# Patient Record
Sex: Female | Born: 1965 | Hispanic: Yes | Smoking: Never smoker
Health system: Southern US, Community
[De-identification: ages and names within clinical notes are randomized; demographics above are authoritative.]

## PROBLEM LIST (undated history)

## (undated) DIAGNOSIS — I10 Essential (primary) hypertension: Secondary | ICD-10-CM

## (undated) DIAGNOSIS — F419 Anxiety disorder, unspecified: Secondary | ICD-10-CM

## (undated) DIAGNOSIS — E079 Disorder of thyroid, unspecified: Secondary | ICD-10-CM

## (undated) HISTORY — PX: WRIST SURGERY: SHX841

---

## 2016-02-02 ENCOUNTER — Emergency Department (HOSPITAL_COMMUNITY): Payer: Self-pay

## 2016-02-02 ENCOUNTER — Encounter (HOSPITAL_COMMUNITY): Payer: Self-pay | Admitting: *Deleted

## 2016-02-02 ENCOUNTER — Emergency Department (HOSPITAL_COMMUNITY)
Admission: EM | Admit: 2016-02-02 | Discharge: 2016-02-02 | Disposition: A | Discharge status: Home / Self Care | Payer: Self-pay | Attending: Emergency Medicine | Admitting: Emergency Medicine

## 2016-02-02 DIAGNOSIS — R791 Abnormal coagulation profile: Secondary | ICD-10-CM | POA: Insufficient documentation

## 2016-02-02 DIAGNOSIS — M542 Cervicalgia: Secondary | ICD-10-CM | POA: Insufficient documentation

## 2016-02-02 DIAGNOSIS — R200 Anesthesia of skin: Secondary | ICD-10-CM

## 2016-02-02 DIAGNOSIS — R202 Paresthesia of skin: Secondary | ICD-10-CM | POA: Insufficient documentation

## 2016-02-02 LAB — DIFFERENTIAL
Basophils Absolute: 0 10*3/uL (ref 0.0–0.1)
Basophils Relative: 0 %
EOS ABS: 0.5 10*3/uL (ref 0.0–0.7)
EOS PCT: 4 %
LYMPHS ABS: 2.8 10*3/uL (ref 0.7–4.0)
Lymphocytes Relative: 23 %
MONOS PCT: 6 %
Monocytes Absolute: 0.7 10*3/uL (ref 0.1–1.0)
Neutro Abs: 7.9 10*3/uL — ABNORMAL HIGH (ref 1.7–7.7)
Neutrophils Relative %: 67 %

## 2016-02-02 LAB — COMPREHENSIVE METABOLIC PANEL
ALT: 19 U/L (ref 14–54)
ANION GAP: 9 (ref 5–15)
AST: 18 U/L (ref 15–41)
Albumin: 3.9 g/dL (ref 3.5–5.0)
Alkaline Phosphatase: 70 U/L (ref 38–126)
BILIRUBIN TOTAL: 0.4 mg/dL (ref 0.3–1.2)
BUN: 7 mg/dL (ref 6–20)
CO2: 28 mmol/L (ref 22–32)
Calcium: 9.4 mg/dL (ref 8.9–10.3)
Chloride: 100 mmol/L — ABNORMAL LOW (ref 101–111)
Creatinine, Ser: 0.8 mg/dL (ref 0.44–1.00)
GFR calc Af Amer: >60 mL/min (ref >60)
Glucose, Bld: 102 mg/dL — ABNORMAL HIGH (ref 65–99)
POTASSIUM: 3.1 mmol/L — AB (ref 3.5–5.1)
Sodium: 137 mmol/L (ref 135–145)
TOTAL PROTEIN: 7.4 g/dL (ref 6.5–8.1)

## 2016-02-02 LAB — CBC
HEMATOCRIT: 42.5 % (ref 36.0–46.0)
HEMOGLOBIN: 13.9 g/dL (ref 12.0–15.0)
MCH: 27.9 pg (ref 26.0–34.0)
MCHC: 32.7 g/dL (ref 30.0–36.0)
MCV: 85.2 fL (ref 78.0–100.0)
Platelets: 343 10*3/uL (ref 150–400)
RBC: 4.99 MIL/uL (ref 3.87–5.11)
RDW: 14 % (ref 11.5–15.5)
WBC: 12 10*3/uL — ABNORMAL HIGH (ref 4.0–10.5)

## 2016-02-02 LAB — I-STAT CHEM 8, ED
BUN: 7 mg/dL (ref 6–20)
CALCIUM ION: 1.11 mmol/L — AB (ref 1.15–1.40)
CREATININE: 0.6 mg/dL (ref 0.44–1.00)
Chloride: 99 mmol/L — ABNORMAL LOW (ref 101–111)
Glucose, Bld: 102 mg/dL — ABNORMAL HIGH (ref 65–99)
HEMATOCRIT: 43 % (ref 36.0–46.0)
HEMOGLOBIN: 14.6 g/dL (ref 12.0–15.0)
Potassium: 3.1 mmol/L — ABNORMAL LOW (ref 3.5–5.1)
SODIUM: 140 mmol/L (ref 135–145)
TCO2: 28 mmol/L (ref 0–100)

## 2016-02-02 LAB — PROTIME-INR
INR: 1
Prothrombin Time: 13.2 seconds (ref 11.4–15.2)

## 2016-02-02 LAB — I-STAT TROPONIN, ED: TROPONIN I, POC: 0 ng/mL (ref 0.00–0.08)

## 2016-02-02 LAB — APTT: aPTT: 30 seconds (ref 24–36)

## 2016-02-02 MED ORDER — PREDNISONE 20 MG PO TABS: ORAL_TABLET | ORAL | 0 refills | Status: DC

## 2016-02-02 MED ORDER — POTASSIUM CHLORIDE CRYS ER 20 MEQ PO TBCR
40.0000 meq | EXTENDED_RELEASE_TABLET | Freq: Once | ORAL | Status: AC
  Administered 2016-02-02: 40 meq via ORAL
  Filled 2016-02-02: qty 2

## 2016-02-02 MED ORDER — OXYCODONE-ACETAMINOPHEN 5-325 MG PO TABS: 1.0000 | ORAL_TABLET | ORAL | 0 refills | Status: DC | PRN

## 2016-02-02 MED ORDER — OXYCODONE-ACETAMINOPHEN 5-325 MG PO TABS
1.0000 | ORAL_TABLET | Freq: Once | ORAL | Status: AC
  Administered 2016-02-02: 1 via ORAL
  Filled 2016-02-02: qty 1

## 2016-02-02 NOTE — ED Notes (Signed)
Brought patient back to room with family in tow; patient getting undressed and into a gown at this time 

## 2016-02-02 NOTE — Discharge Instructions (Signed)
Take the prescribed medication as directed. Follow-up with neurology regarding numbness/paresthesias in your right arm.  If you continue having issues with your neck as well, you may wish to see orthopedics-- number listed for you. Return to the ED for new or worsening symptoms.

## 2016-02-02 NOTE — ED Provider Notes (Signed)
[facility]-EMERGENCY DEPT Provider Note   CSN: [id number] Arrival date & time: 02/02/16  0006     History   Chief Complaint Chief Complaint  Patient presents with  . Numbness    HPI Linda Parsons is a 50 y.o. female.  The history is provided by the patient and medical records.    50 year old female with history of chronic neck pain, Presenting to the ED for numbness and weakness. Patient reports history of chronic neck pain for over a year. Her daughter reports that she has had chronic issues with her neck for several years now.  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  She states yesterday morning around 10 AM she developed some numbness and feeling of paresthesias in her right arm, soon after that developed in her right leg as well. She states at this time her leg no longer feels numb, but she does have a burning sensation down the front of her thigh. Denies any low back pain.  No bowel/bladder incontinence.  States she continues having some paresthesias of her right arm, worse in the hand. States her chronic neck pain is unchanged. No new injury or falls. States yesterday she was trying to use the computer and felt as if her hand would not "work right". States she was unable to tight and her hand felt very weak. She is right-hand dominant. She denies any dizziness, confusion, visual disturbance, changes in speech, or difficulty ambulating. She is not currently on any type of anticoagulation. She has no history of TIA or stroke.  History reviewed. No pertinent past medical history.  There are no active problems to display for this patient.   History reviewed. No pertinent surgical history.  OB History    No data available       Home Medications    Prior to Admission medications   Not on File    Family History No family history on file.  Social History Social History  Substance Use Topics   . Smoking status: Never Smoker  . Smokeless tobacco: Never Used  . Alcohol use No     Allergies   Review of patient's allergies indicates no known allergies.   Review of Systems Review of Systems  Neurological: Positive for weakness and numbness.  All other systems reviewed and are negative.    Physical Exam Updated Vital Signs BP 136/88   Pulse 67   Temp 97.8 F (36.6 C) (Oral)   Resp 15   Ht 5\' 6"  (1.676 m)   Wt 80.7 kg   SpO2 98%   BMI 28.73 kg/m   Physical Exam  Constitutional: She is oriented to person, place, and time. She appears well-developed and well-nourished. No distress.  HENT:  Head: Normocephalic and atraumatic.  Right Ear: External ear normal.  Left Ear: External ear normal.  Eyes: Conjunctivae and EOM are normal. Pupils are equal, round, and reactive to light.  EOMs fully intact, no nystagmus; pupils symmetric and reactive bilaterally  Neck: Normal range of motion and full passive range of motion without pain. Neck supple. No neck rigidity.  No rigidity, no meningismus  Cardiovascular: Normal rate, regular rhythm and normal heart sounds.   No murmur heard. Pulmonary/Chest: Effort normal and breath sounds normal. No respiratory distress. She has no wheezes. She has no rhonchi.  Abdominal: Soft. Bowel sounds are normal. There is no tenderness. There is no guarding.  Musculoskeletal: Normal range  of motion. She exhibits no edema.  Legs normal in appearance, no swelling or rashes  Neurological: She is alert and oriented to person, place, and time. She has normal strength. She displays no tremor. No cranial nerve deficit or sensory deficit. She displays no seizure activity.  AAOx3, answering questions and following commands appropriately; right grip is weaker than left; normal strength of both legs; normal sensation throughout; moving all extremities normally without ataxia; no truncal ataxia; negative pronator drift; normal speech, symmetric smile without  facial droop  Skin: Skin is warm and dry. No rash noted. She is not diaphoretic.  Psychiatric: She has a normal mood and affect. Her behavior is normal. Thought content normal.  Nursing note and vitals reviewed.    ED Treatments / Results  Labs (all labs ordered are listed, but only abnormal results are displayed) Labs Reviewed  CBC - Abnormal; Notable for the following:       Result Value   WBC 12.0 (*)    All other components within normal limits  DIFFERENTIAL - Abnormal; Notable for the following:    Neutro Abs 7.9 (*)    All other components within normal limits  COMPREHENSIVE METABOLIC PANEL - Abnormal; Notable for the following:    Potassium 3.1 (*)    Chloride 100 (*)    Glucose, Bld 102 (*)    All other components within normal limits  I-STAT CHEM 8, ED - Abnormal; Notable for the following:    Potassium 3.1 (*)    Chloride 99 (*)    Glucose, Bld 102 (*)    Calcium, Ion 1.11 (*)    All other components within normal limits  PROTIME-INR  APTT  I-STAT TROPOININ, ED  CBG MONITORING, ED    EKG  EKG Interpretation None       Radiology Ct Head Wo Contrast  Result Date: 02/02/2016 CLINICAL DATA:  Right-sided numbness and weakness. Symptoms started today. Pain for 1 year. No known injury EXAM: CT HEAD WITHOUT CONTRAST TECHNIQUE: Contiguous axial images were obtained from the base of the skull through the vertex without intravenous contrast. COMPARISON:  None. FINDINGS: Brain: No evidence of acute infarction, hemorrhage, hydrocephalus, extra-axial collection or mass lesion/mass effect. Vascular: No hyperdense vessel or unexpected calcification. Skull: Normal. Negative for fracture or focal lesion. Sinuses/Orbits: No acute finding. Other: None. IMPRESSION: No acute intracranial abnormalities. Electronically Signed   By: Burman Nieves M.D.   On: 02/02/2016 02:31    Procedures Procedures (including critical care time)  Medications Ordered in ED Medications - No data  to display   Initial Impression / Assessment and Plan / ED Course  I have reviewed the triage vital signs and the nursing notes.  Pertinent labs & imaging results that were available during my care of the patient were reviewed by me and considered in my medical decision making (see chart for details).  Clinical Course   50 year old female here with right-sided numbness and weakness. This began yesterday at 10 AM (nearly 12 hours ago), therefore not a code stroke.  She is afebrile and nontoxic. Her only relative deficit noted on exam is weakness of her right rib compared with the left. She does have a history of chronic neck pain which is unchanged over the past year. She reports she has never had weakness in her arms or legs before. Her workup thus far is overall reassuring aside from a mild hypokalemia which was replaced here. Her CT head is negative for any acute findings.  Feel that  her symptoms may very well be due to cervical radiculopathy/herniated disc, however abnormal to have leg involvement with this.  No back pain to explain this otherwise.  Will obtain MRI of brain/CS to r/o acute pathology.  MRI of brain and cervical spine negative for any acute pathology.  Patient reports some relief from the pain medication but pain still present. Continues to report a paresthesias type sensation in her right hand. Suspect this may be a cervical radiculopathy.  Have given referrals to neurology as well as orthopedics.  May have some benefit from seeing a chiropractor as well.  Rx percocet, prednisone.  Discussed plan with patient and daughter, they acknowledged understanding and agreed with plan of care.  Return precautions given for new or worsening symptoms.  Final Clinical Impressions(s) / ED Diagnoses   Final diagnoses:  Neck pain  Numbness and tingling in right hand    New Prescriptions New Prescriptions   OXYCODONE-ACETAMINOPHEN (PERCOCET/ROXICET) 5-325 MG TABLET    Take 1 tablet by mouth  every 4 (four) hours as needed.   PREDNISONE (DELTASONE) 20 MG TABLET    Take 40 mg by mouth daily for 3 days, then 20mg  by mouth daily for 3 days, then 10mg  daily for 3 days     Garlon Hatchet, PA-C 02/02/16 1306    Gerhard Munch, MD 02/04/16 [phone]

## 2016-02-02 NOTE — ED Notes (Addendum)
Pt in MRI.

## 2016-02-02 NOTE — ED Triage Notes (Signed)
Pt states that around 10 am yesterday she started having a tingling in the right arm with weakness, and then the same feeling in the right leg. She says she has also been having pain in the upper right arm and neck pain. Denies injury.

## 2016-02-02 NOTE — ED Notes (Signed)
Patient transported to MRI 

## 2016-02-02 NOTE — ED Notes (Signed)
Family member up at desk looking for update, provided with and apologized for wait.

## 2019-02-08 ENCOUNTER — Emergency Department
Admission: EM | Admit: 2019-02-08 | Discharge: 2019-02-09 | Disposition: A | Discharge status: Home / Self Care | Payer: Self-pay | Attending: Emergency Medicine | Admitting: Emergency Medicine

## 2019-02-08 ENCOUNTER — Emergency Department: Payer: Self-pay

## 2019-02-08 DIAGNOSIS — Y929 Unspecified place or not applicable: Secondary | ICD-10-CM | POA: Insufficient documentation

## 2019-02-08 DIAGNOSIS — Y999 Unspecified external cause status: Secondary | ICD-10-CM | POA: Insufficient documentation

## 2019-02-08 DIAGNOSIS — Z79899 Other long term (current) drug therapy: Secondary | ICD-10-CM | POA: Insufficient documentation

## 2019-02-08 DIAGNOSIS — Y939 Activity, unspecified: Secondary | ICD-10-CM | POA: Insufficient documentation

## 2019-02-08 DIAGNOSIS — S52572A Other intraarticular fracture of lower end of left radius, initial encounter for closed fracture: Secondary | ICD-10-CM

## 2019-02-08 DIAGNOSIS — I10 Essential (primary) hypertension: Secondary | ICD-10-CM | POA: Insufficient documentation

## 2019-02-08 DIAGNOSIS — Z91040 Latex allergy status: Secondary | ICD-10-CM | POA: Insufficient documentation

## 2019-02-08 HISTORY — DX: Disorder of thyroid, unspecified: E07.9

## 2019-02-08 HISTORY — DX: Essential (primary) hypertension: I10

## 2019-02-08 MED ORDER — ONDANSETRON 4 MG PO TBDP: 4.0000 mg | ORAL_TABLET | Freq: Three times a day (TID) | ORAL | 0 refills | Status: DC | PRN

## 2019-02-08 MED ORDER — HYDROMORPHONE HCL 1 MG/ML IJ SOLN
1.0000 mg | Freq: Once | INTRAMUSCULAR | Status: AC
  Administered 2019-02-08: 1 mg via INTRAMUSCULAR

## 2019-02-08 MED ORDER — ONDANSETRON 4 MG PO TBDP
4.0000 mg | ORAL_TABLET | Freq: Once | ORAL | Status: AC
  Administered 2019-02-08: 4 mg via ORAL

## 2019-02-08 MED ORDER — OXYCODONE-ACETAMINOPHEN 5-325 MG PO TABS: 1.0000 | ORAL_TABLET | ORAL | 0 refills | Status: AC | PRN

## 2019-02-08 MED ORDER — ONDANSETRON 4 MG PO TBDP
ORAL_TABLET | ORAL | Status: AC
  Filled 2019-02-08: qty 1

## 2019-02-08 MED ORDER — HYDROMORPHONE HCL 1 MG/ML IJ SOLN
INTRAMUSCULAR | Status: AC
  Filled 2019-02-08: qty 1
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
## 2019-02-08 NOTE — ED Notes (Signed)
Spoke with Lenna Sciara, sane, fne who is coming in to speak with pt. Pt states she would like information on a safe place to stay tonight.

## 2019-02-08 NOTE — ED Notes (Signed)
Phillip Heal officer in to speak with pt.

## 2019-02-08 NOTE — ED Notes (Signed)
Chris pa in with pa student to place pt in splint.
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
## 2019-02-08 NOTE — ED Notes (Signed)
Linda Parsons, fne, sane speaking with pt with assist of spanish interpreter service [employer].

## 2019-02-08 NOTE — ED Notes (Signed)
Waiting on family to arrive and sane, fne rn to speak with pt.

## 2019-02-08 NOTE — Discharge Instructions (Addendum)
Please keep splint clean and dry.  Call orthopedic office in 2 days to schedule follow-up appointment.  Take pain medication as prescribed.  Return to the ER for any worsening concerns or changes in health       Interpersonal Violence   Interpersonal Violence aka Domestic Violence is defined as violence between people who have had a personal relationship. For example, someone you have ever dated, been married to or in a domestic partnership with. Someone with whom you have a child in common, or a current  household member.  Does one or more of the following   attempts to cause bodily injury, or intentionally causes bodily injury;  places you or a member of your family or household in fear of imminent serious bodily injury;  continued harassment that rises to such a level as to inflict substantial emotional distress; or  commits any rape or sexual offense  You are not alone. Unfortunately domestic violence is very common. Domestic violence does not go away on its own and tends to get worse over time and more frequent. There are people who can help. There are resources included in these instructions. Evidence can be collected in case you want to notify law enforcement now or in the future. A forensic nurse can take photographs and create a medical/legal document of the incident. If you choose to report to law enforcement, they will request a copy of the chart which we can provide with your permission. We can call in social work or an advocate to help with safety planning and emergency placement in a shelter if you have no other safe options.  THE POLICE CAN HELP YOU:   Get to a safe place away from the violence.   Get information on how the court can help protect you against the violence.   Get necessary belongings from your home for you and your children.   Get copies of police reports about the violence.   File a complaint in criminal court.   Find where local criminal and family  courts are located.   [facility] [place] with Shelter  Obtaining a Landscape architect (50B)  Careers adviser  Support Group  Theatre manager with domestic violence related criminal charges  Child Public affairs consultant Assistance Enrollment  Job Readiness  Budget Counseling   Coaching and Mentoring  Call your local domestic violence program for additional information and support.   [facility]   336-641-SAFE Crisis Line [place] [facility] [place]   404-885-6101 Crisis Line [phone] [facility] [phone]  National Domestic Violence Abuse Hotline  6101154141    On Monday morning call the [facility]. As we discussed, make sure you tell them your age as they have services for older adults. There are people there who speak spanish.  If you feel unsafe this weekend, call 911 for help.
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
## 2019-02-17 NOTE — SANE Note (Signed)
Follow-up Phone Call  Patient gives verbal consent for a FNE/SANE follow-up phone call in 48-72 hours: No Patient's telephone number: not asked Patient gives verbal consent to leave voicemail at the phone number listed above: No DO NOT CALL between the hours of: Patient does not wish to be contacted.
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
## 2019-02-17 NOTE — SANE Note (Signed)
The SANE/FNE (Forensic Nurse Examiner) consult has been completed by Kyleah Pensabene, RN, FNE. The primary or charge RN and physician have been notified. Please contact the SANE/FNE nurse on call (listed in Amion) with any further concerns.  

## 2019-11-13 ENCOUNTER — Emergency Department
Admission: EM | Admit: 2019-11-13 | Discharge: 2019-11-14 | Disposition: A | Discharge status: Home / Self Care | Payer: Self-pay | Attending: Emergency Medicine | Admitting: Emergency Medicine

## 2019-11-13 ENCOUNTER — Emergency Department: Payer: Self-pay

## 2019-11-13 ENCOUNTER — Other Ambulatory Visit: Payer: Self-pay

## 2019-11-13 DIAGNOSIS — F419 Anxiety disorder, unspecified: Secondary | ICD-10-CM | POA: Insufficient documentation

## 2019-11-13 DIAGNOSIS — Z91040 Latex allergy status: Secondary | ICD-10-CM | POA: Insufficient documentation

## 2019-11-13 DIAGNOSIS — Z880 Allergy status to penicillin: Secondary | ICD-10-CM | POA: Insufficient documentation

## 2019-11-13 DIAGNOSIS — I10 Essential (primary) hypertension: Secondary | ICD-10-CM | POA: Insufficient documentation

## 2019-11-13 DIAGNOSIS — R079 Chest pain, unspecified: Secondary | ICD-10-CM | POA: Insufficient documentation

## 2019-11-13 DIAGNOSIS — Z79899 Other long term (current) drug therapy: Secondary | ICD-10-CM | POA: Insufficient documentation

## 2019-11-13 HISTORY — DX: Anxiety disorder, unspecified: F41.9

## 2019-11-13 LAB — CBC
HCT: 39.1 % (ref 36.0–46.0)
Hemoglobin: 13.7 g/dL (ref 12.0–15.0)
MCH: 29.3 pg (ref 26.0–34.0)
MCHC: 35 g/dL (ref 30.0–36.0)
MCV: 83.5 fL (ref 80.0–100.0)
Platelets: 326 10*3/uL (ref 150–400)
RBC: 4.68 MIL/uL (ref 3.87–5.11)
RDW: 13.2 % (ref 11.5–15.5)
WBC: 9.8 10*3/uL (ref 4.0–10.5)
nRBC: 0 % (ref 0.0–0.2)

## 2019-11-13 LAB — BASIC METABOLIC PANEL
Anion gap: 10 (ref 5–15)
BUN: 11 mg/dL (ref 6–20)
CO2: 26 mmol/L (ref 22–32)
Calcium: 8.7 mg/dL — ABNORMAL LOW (ref 8.9–10.3)
Chloride: 103 mmol/L (ref 98–111)
Creatinine, Ser: 0.9 mg/dL (ref 0.44–1.00)
GFR calc Af Amer: >60 mL/min (ref >60)
GFR calc non Af Amer: >60 mL/min (ref >60)
Glucose, Bld: 103 mg/dL — ABNORMAL HIGH (ref 70–99)
Potassium: 3.5 mmol/L (ref 3.5–5.1)
Sodium: 139 mmol/L (ref 135–145)

## 2019-11-13 LAB — TROPONIN I (HIGH SENSITIVITY)
Troponin I (High Sensitivity): 8 ng/L (ref <18)
Troponin I (High Sensitivity): 8 ng/L (ref <18)

## 2019-11-13 MED ORDER — SODIUM CHLORIDE 0.9% FLUSH: 3.0000 mL | Freq: Once | INTRAVENOUS | Status: DC

## 2019-11-13 NOTE — ED Triage Notes (Signed)
First nurse note- chest pain arrived EMS.  ASA 324 mg by EMS, NTG X 1 with EMS.  PIV by EMS.  VSS with EMS

## 2019-11-13 NOTE — ED Triage Notes (Signed)
Pt to ed via ems with complaints of cp 1hr prior to arrival. PT felt dizzy and had pain radiating to jaw.Received 4 baby asa and 1 nitro which helped the pain.

## 2019-11-14 ENCOUNTER — Emergency Department: Payer: Self-pay

## 2019-11-14 LAB — LIPASE, BLOOD: Lipase: 33 U/L (ref 11–51)

## 2019-11-14 LAB — HEPATIC FUNCTION PANEL
ALT: 26 U/L (ref 0–44)
AST: 21 U/L (ref 15–41)
Albumin: 3.9 g/dL (ref 3.5–5.0)
Alkaline Phosphatase: 66 U/L (ref 38–126)
Bilirubin, Direct: <0.1 mg/dL (ref 0.0–0.2)
Total Bilirubin: 0.8 mg/dL (ref 0.3–1.2)
Total Protein: 7.5 g/dL (ref 6.5–8.1)

## 2019-11-14 NOTE — ED Provider Notes (Signed)
[facility] Emergency Department Provider Note  ____________________________________________   First MD Initiated Contact with Patient 11/14/19 0000     (approximate)  I have reviewed the triage vital signs and the nursing notes.   HISTORY  Chief Complaint Chest Pain    HPI Linda Parsons is a 54 y.o. female with medical history as listed below was notably includes anxiety.  She presents for evaluation of acute onset upper abdominal and chest pain that radiates to her jaw.  She reports that the symptoms started this afternoon at about 4 PM after she had an apparently contentious conversation with someone.  She is not sure if her anxiety is contributing or not.  The pain started in her upper abdomen and radiated up into her chest and then eventually up into her neck over the space of a few hours.  The symptoms are mostly gone now but she feels a residual discomfort in her chest.  She has had some nausea but no vomiting.  The symptoms did not start right after eating.  She does not remember having similar symptoms in the past.  She has no history of heart disease or blood clots in the legs of the lungs and has had no unilateral leg pain or swelling or immobilization.  She denies fever, sore throat, and dysuria.  She is still nervous at this time that something might be wrong but otherwise feels relatively well.  The onset of symptoms was relatively acute and worsened over time but is now better and nothing particular made the symptoms better or worse.         Past Medical History:  Diagnosis Date  . Anxiety   . Hypertension   . Thyroid disease     There are no problems to display for this patient.   Past Surgical History:  Procedure Laterality Date  . WRIST SURGERY Left     Prior to Admission medications   Medication Sig Start Date End Date Taking? Authorizing Provider  hydrochlorothiazide (HYDRODIURIL) 12.5 MG tablet Take 12.5 mg by mouth daily.     [provider]  levothyroxine (SYNTHROID, LEVOTHROID) 88 MCG tablet Take 88 mcg by mouth daily before breakfast.    [provider]  ondansetron (ZOFRAN ODT) 4 MG disintegrating tablet Take 1 tablet (4 mg total) by mouth every 8 (eight) hours as needed for nausea or vomiting. 02/08/19   Evon Slack, PA-C  oxyCODONE-acetaminophen (PERCOCET) 5-325 MG tablet Take 1 tablet by mouth every 4 (four) hours as needed for severe pain. 02/08/19 02/08/20  Evon Slack, PA-C  predniSONE (DELTASONE) 20 MG tablet Take 40 mg by mouth daily for 3 days, then 20mg  by mouth daily for 3 days, then 10mg  daily for 3 days 02/02/16   , PA-C  sertraline (ZOLOFT) 50 MG tablet Take 50 mg by mouth daily.    [provider]    Allergies Latex and Penicillins  No family history on file.  Social History Social History   Tobacco Use  . Smoking status: Never Smoker  . Smokeless tobacco: Never Used  Substance Use Topics  . Alcohol use: No  . Drug use: Not on file    Review of Systems Constitutional: No fever/chills Eyes: No visual changes. ENT: No sore throat. Cardiovascular: +chest pain. Respiratory: Denies shortness of breath. Gastrointestinal: Upper abdominal pain.  Nausea, no vomiting.  No diarrhea.  No constipation. Genitourinary: Negative for dysuria. Musculoskeletal: Negative for neck pain.  Negative for back pain.  Integumentary: Negative for rash. Neurological: Negative for headaches, focal weakness or numbness.   ____________________________________________   PHYSICAL EXAM:  VITAL SIGNS: ED Triage Vitals  Enc Vitals Group     BP 11/13/19 1828 133/89     Pulse Rate 11/13/19 1828 74     Resp 11/13/19 1828 20     Temp 11/13/19 1828 97.8 F (36.6 C)     Temp Source 11/13/19 1828 Oral     SpO2 11/13/19 2032 96 %     Weight 11/13/19 1829 80.3 kg (177 lb)     Height 11/13/19 1829 1.676 m (5\' 6" )     Head Circumference --      Peak Flow --       Pain Score 11/13/19 1829 3     Pain Loc --      Pain Edu? --      Excl. in GC? --     Constitutional: Alert and oriented.  Eyes: Conjunctivae are normal.  Head: Atraumatic. Nose: No congestion/rhinnorhea. Mouth/Throat: Patient is wearing a mask. Neck: No stridor.  No meningeal signs.   Cardiovascular: Normal rate, regular rhythm. Good peripheral circulation. Grossly normal heart sounds. Respiratory: Normal respiratory effort.  No retractions. Gastrointestinal: Soft and nondistended.  Tenderness to palpation of the epigastrium which results and some involuntary guarding.  Equivocal tenderness to the right upper quadrant and equivocal Murphy sign.  No lower abdominal tenderness to palpation. Musculoskeletal: No lower extremity tenderness nor edema. No gross deformities of extremities. Neurologic:  Normal speech and language. No gross focal neurologic deficits are appreciated.  Skin:  Skin is warm, dry and intact. Psychiatric: Mood and affect are anxious but generally normal under the circumstances. Speech and behavior are normal.  ____________________________________________   LABS (all labs ordered are listed, but only abnormal results are displayed)  Labs Reviewed  BASIC METABOLIC PANEL - Abnormal; Notable for the following components:      Result Value   Glucose, Bld 103 (*)    Calcium 8.7 (*)    All other components within normal limits  CBC  HEPATIC FUNCTION PANEL  LIPASE, BLOOD  POC URINE PREG, ED  TROPONIN I (HIGH SENSITIVITY)  TROPONIN I (HIGH SENSITIVITY)   ____________________________________________  EKG  ED ECG REPORT I, 11/15/19, the attending physician, personally viewed and interpreted this ECG.  Date: 11/13/2019 EKG Time: 17:05 Rate: 73 Rhythm: normal sinus rhythm with sinus arrhythmia QRS Axis: normal Intervals: normal ST/T Wave abnormalities: normal Narrative Interpretation: no evidence of acute  ischemia  ____________________________________________  RADIOLOGY I, 11/15/2019, personally viewed and evaluated these images (plain radiographs) as part of my medical decision making, as well as reviewing the written report by the radiologist.  ED MD interpretation:  No acute abnormalities on CXR  Official radiology report(s): DG Chest 2 View  Result Date: 11/13/2019 CLINICAL DATA:  Chest pain EXAM: CHEST - 2 VIEW COMPARISON:  09/13/2017 FINDINGS: Heart and mediastinal contours are within normal limits. No focal opacities or effusions. No acute bony abnormality. IMPRESSION: No active cardiopulmonary disease. Electronically Signed   By: 11/13/2017 M.D.   On: 11/13/2019 19:01   11/15/2019 ABDOMEN LIMITED RUQ  Result Date: 11/14/2019 CLINICAL DATA:  Initial evaluation for acute epigastric pain radiating to the chest. EXAM: ULTRASOUND ABDOMEN LIMITED RIGHT UPPER QUADRANT COMPARISON:  None. FINDINGS: Gallbladder: 4 mm nonmobile nonshadowing echogenic focus adherent to the gallbladder wall suggestive of a small polyp. Mild gallbladder wall irregularity with a few scattered foci of comet tail artifact suggest mild  adenomyomatosis. No shadowing echogenic stones or internal sludge. Gallbladder wall measures within normal limits at 2.6 mm. No free pericholecystic fluid. No sonographic Murphy sign elicited on exam. Common bile duct: Diameter: 4.5 mm Liver: No focal lesion identified. Mildly increased echogenicity noted within the hepatic parenchyma. Portal vein is patent on color Doppler imaging with normal direction of blood flow towards the liver. Other: None. IMPRESSION: 1. No sonographic evidence for cholelithiasis, acute cholecystitis, or biliary dilatation. 2. Mildly increased echogenicity within the hepatic parenchyma, which can be seen with steatosis or other intrinsic hepatocellular disease. Correlation with LFTs suggested. 3. 4 mm nonmobile echogenic focus within the gallbladder lumen, most suggestive of  a small polyp. This is almost certainly benign given size, with no follow-up imaging recommended. 4. Additional mild gallbladder wall irregularity with a few scattered foci of comet tail artifact, most characteristic of adenomyomatosis. Electronically Signed   By: Rise MuBenjamin  McClintock M.D.   On: 11/14/2019 02:12    ____________________________________________   PROCEDURES   Procedure(s) performed (including Critical Care):  Procedures   ____________________________________________   INITIAL IMPRESSION / MDM / ASSESSMENT AND PLAN / ED COURSE  As part of my medical decision making, I reviewed the following data within the electronic MEDICAL RECORD NUMBER Nursing notes reviewed and incorporated, Labs reviewed , EKG interpreted , Old chart reviewed, Radiograph reviewed  and Notes from prior ED visits   Differential diagnosis includes, but is not limited to, anxiety, gallbladder disease, pneumonia, ACS, PE.  Patient is low risk for ACS and she has a Wells score for PE of 0.  One of her main chronic issues is anxiety and I believe this likely contributes.  EKG is nonischemic and chest x-ray shows no acute abnormalities.  Vital signs have been stable throughout more than 7 hours in the emergency department.  Basic metabolic panel CBC and high-sensitivity troponin x2 are normal.  I added on hepatic function tests and lipase and will obtain a right upper quadrant ultrasound.  Anticipate the patient will be appropriate for discharge if the rest of the results are reassuring and she understands and agrees with the plan.  She is essentially asymptomatic at this time.       Clinical Course as of Nov 14 247  Thu Nov 14, 2019  0215 Reassuring gallbladder ultrasound with no evidence of acute abnormality that would suggest a cause for the patient's symptoms.  US ABDOMEN LIMITED RUQ [CF]  0242 The patient feels well and has been resting comfortably.  I updated her that her hepatic function test and lipase  is levels are normal and that there was no acute abnormality on her gallbladder ultrasound.  She agrees that anxiety contributed to her chest pain and [facility].  I am also giving her the name and number of a local cardiologist with whom she can follow-up.  I gave my usual and customary return precautions.   [CF]    Clinical Course User Index [CF] Loleta RoseForbach, Mikailah Morel, MD     ____________________________________________  FINAL CLINICAL IMPRESSION(S) / ED DIAGNOSES  Final diagnoses:  Chest pain, unspecified type  Anxiety     MEDICATIONS GIVEN DURING THIS VISIT:  Medications  sodium chloride flush (NS) 0.9 % injection 3 mL (has no administration in time range)     ED Discharge Orders    None      *Please note:  Timoteo Gaulleyda Perrow was evaluated in Emergency Department on 11/14/2019 for the symptoms described in the  history of present illness. She was evaluated in the context of the global COVID-19 pandemic, which necessitated consideration that the patient might be at risk for infection with the SARS-CoV-2 virus that causes COVID-19. Institutional protocols and algorithms that pertain to the evaluation of patients at risk for COVID-19 are in a state of rapid change based on information released by regulatory bodies including the CDC and federal and state organizations. These policies and algorithms were followed during the patient's care in the ED.  Some ED evaluations and interventions may be delayed as a result of limited staffing during and after the pandemic.*  Note:  This document was prepared using Dragon voice recognition software and may include unintentional dictation errors.   Loleta Rose, MD 11/14/19 [phone]

## 2019-11-14 NOTE — Discharge Instructions (Signed)

## 2019-11-26 ENCOUNTER — Ambulatory Visit: Payer: Self-pay | Admitting: Cardiovascular Disease

## 2019-11-26 NOTE — Progress Notes (Deleted)
NO SHOW

## 2019-11-27 ENCOUNTER — Encounter: Payer: Self-pay | Admitting: Cardiovascular Disease
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
## 2020-04-27 ENCOUNTER — Ambulatory Visit: Payer: Self-pay | Attending: Internal Medicine

## 2020-04-27 DIAGNOSIS — Z23 Encounter for immunization: Secondary | ICD-10-CM

## 2020-04-27 NOTE — Progress Notes (Signed)
   Covid-19 Vaccination Clinic  Name:  Arial Galligan    MRN: [id number] DOB: [birth year]  04/27/2020  Ms. Garzon was observed post Covid-19 immunization for 15 minutes without incident. She was provided with Vaccine Information Sheet and instruction to access the V-Safe system.   Ms. Macho was instructed to call 911 with any severe reactions post vaccine: Marland Kitchen Difficulty breathing  . Swelling of face and throat  . A fast heartbeat  . A bad rash all over body  . Dizziness and weakness   Immunizations Administered    Name Date Dose VIS Date Route   Pfizer COVID-19 Vaccine 04/27/2020  4:26 PM 0.3 mL 03/04/2020 Intramuscular   Manufacturer: [employer], [employer]   Lot: [id number]   NDC: [address]-8

## 2020-05-04 ENCOUNTER — Ambulatory Visit: Payer: Self-pay

## 2020-09-01 IMAGING — CR DG CHEST 2V
1 series · 2 of 2 positions shown · non-contrast
Comparison: 09/13/2017

CLINICAL DATA: Chest pain

EXAM:
CHEST - 2 VIEW

[Series 1: dg chest 2 view · 0.14mm/px · 2 of 2 slices shown]
[im 1/2]
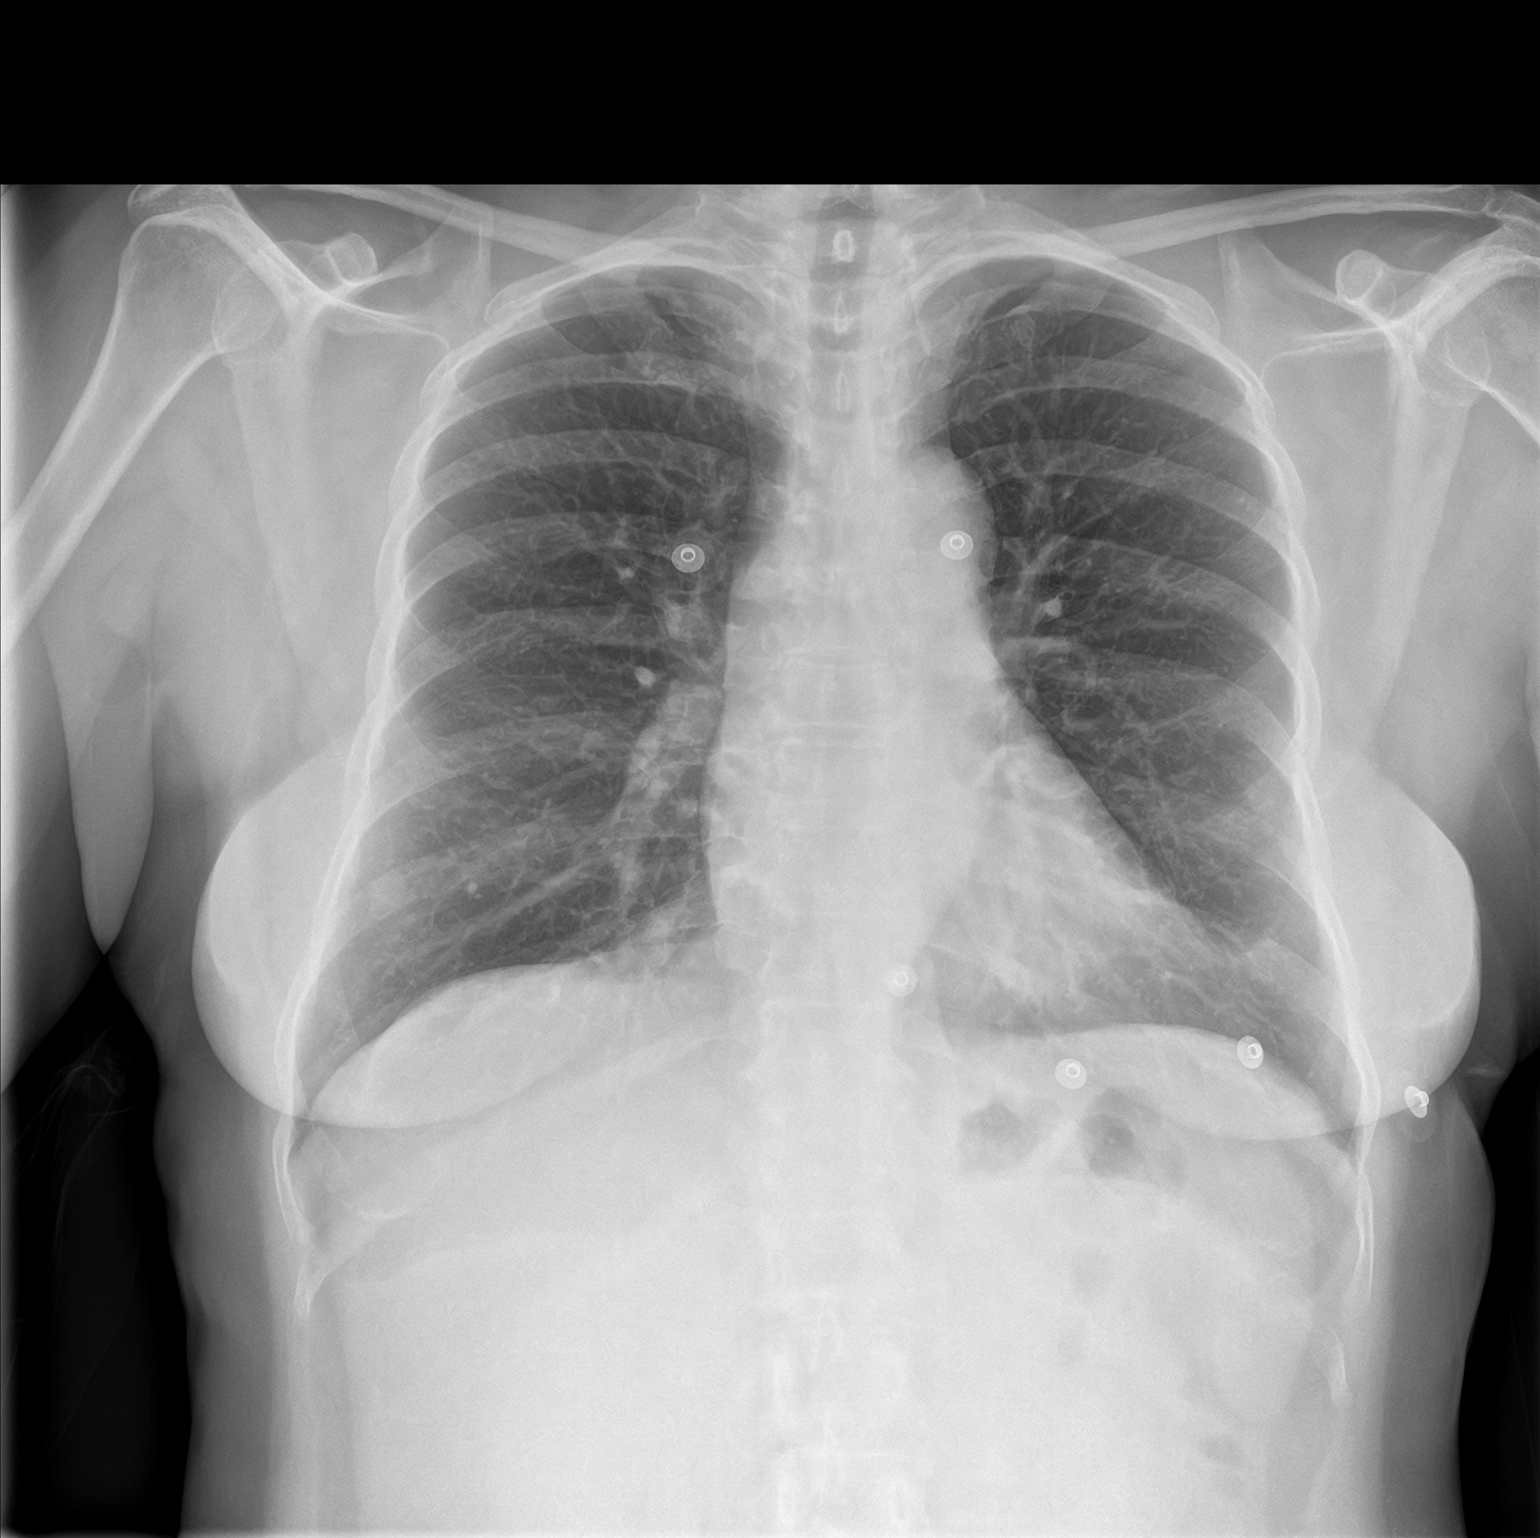
[im 2/2]
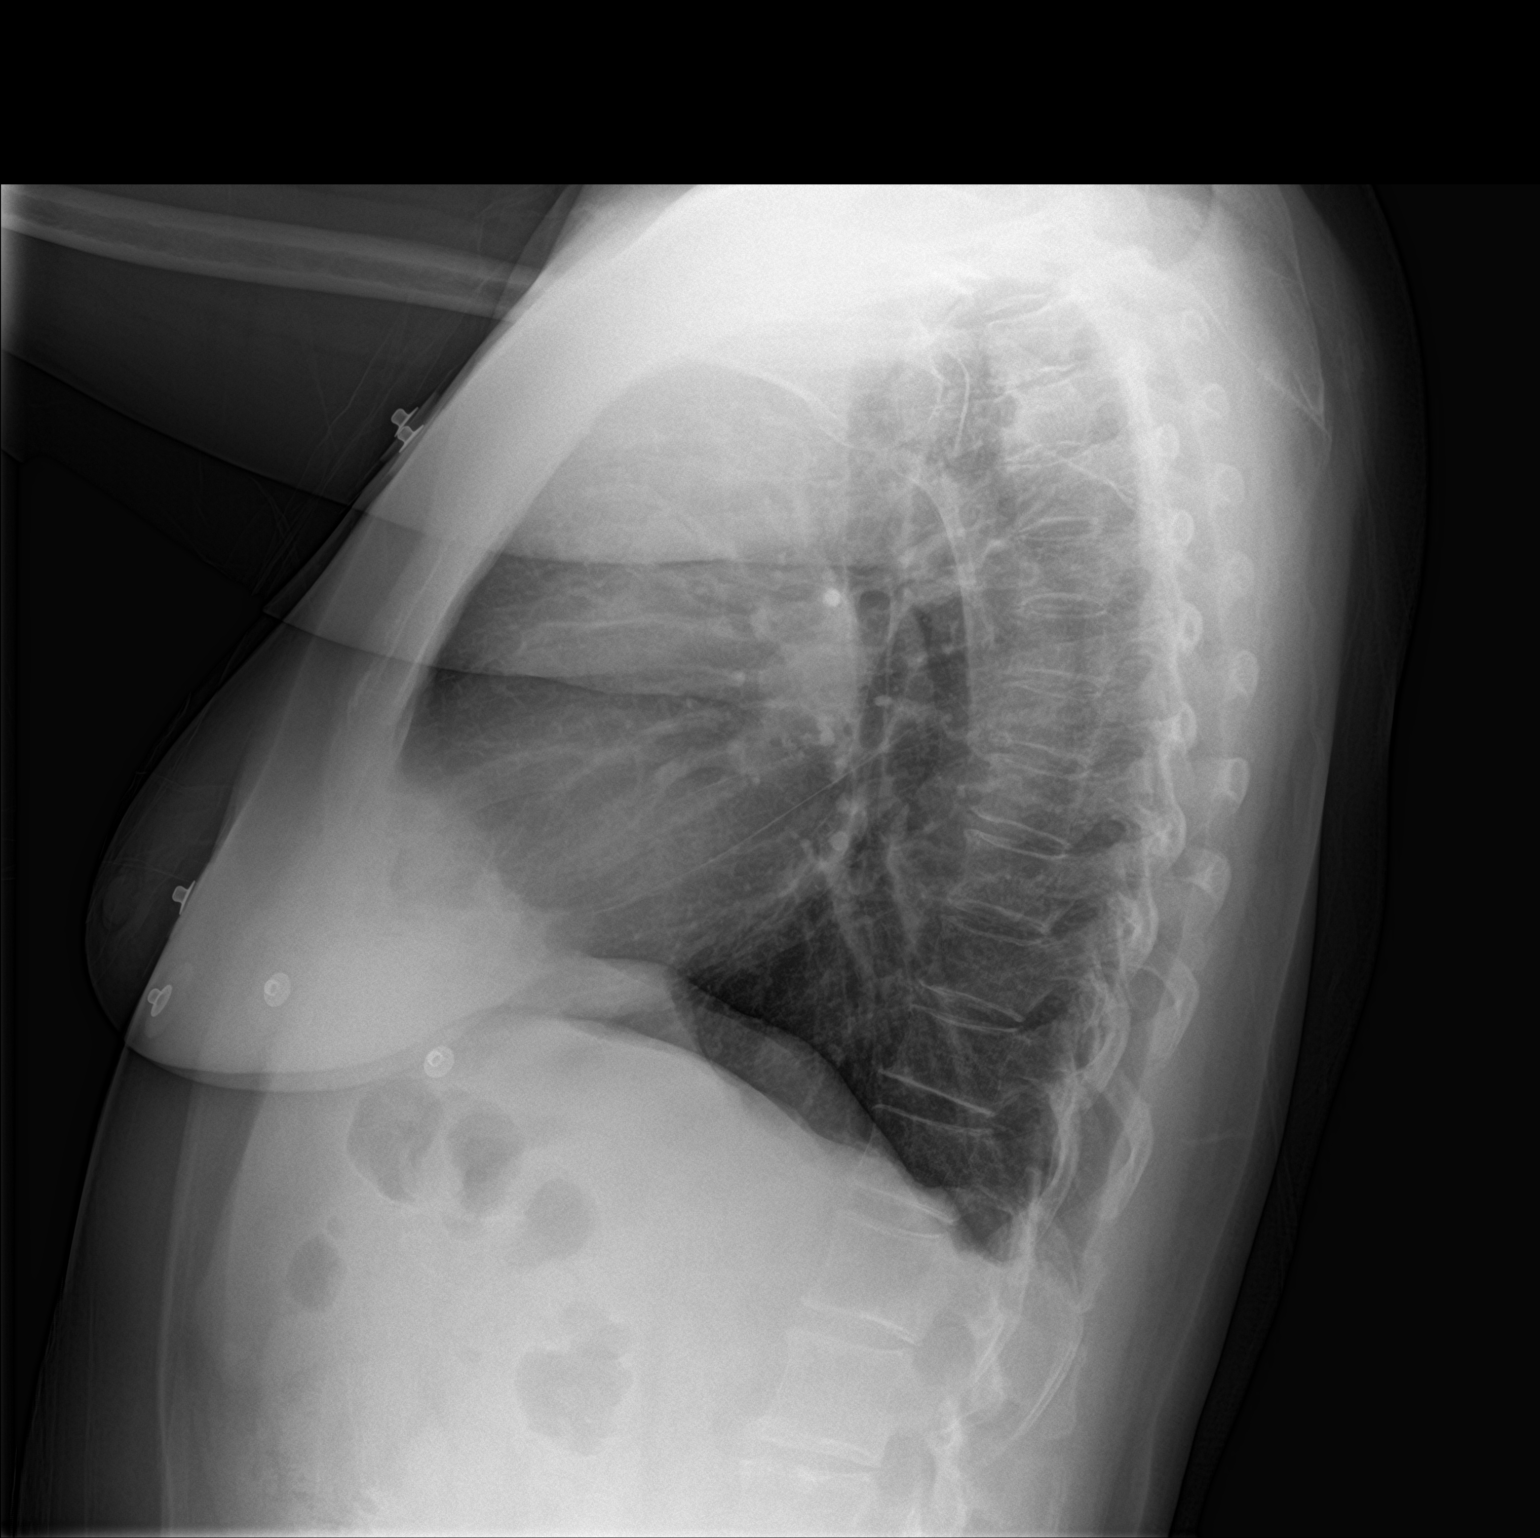

[2 of 2 positions shown; findings below may reference images not displayed]

FINDINGS: Heart and mediastinal contours are within normal limits. No focal
opacities or effusions. No acute bony abnormality.
IMPRESSION: No active cardiopulmonary disease.

## 2022-02-02 ENCOUNTER — Encounter: Payer: Self-pay | Admitting: Emergency Medicine

## 2022-02-02 ENCOUNTER — Emergency Department
Admission: EM | Admit: 2022-02-02 | Discharge: 2022-02-03 | Disposition: A | Discharge status: Home / Self Care | Payer: Self-pay | Attending: Emergency Medicine | Admitting: Emergency Medicine

## 2022-02-02 DIAGNOSIS — T50904A Poisoning by unspecified drugs, medicaments and biological substances, undetermined, initial encounter: Secondary | ICD-10-CM | POA: Insufficient documentation

## 2022-02-02 DIAGNOSIS — F411 Generalized anxiety disorder: Secondary | ICD-10-CM | POA: Diagnosis present

## 2022-02-02 DIAGNOSIS — I10 Essential (primary) hypertension: Secondary | ICD-10-CM | POA: Insufficient documentation

## 2022-02-02 DIAGNOSIS — E876 Hypokalemia: Secondary | ICD-10-CM | POA: Insufficient documentation

## 2022-02-02 DIAGNOSIS — Z20822 Contact with and (suspected) exposure to covid-19: Secondary | ICD-10-CM | POA: Insufficient documentation

## 2022-02-02 DIAGNOSIS — F331 Major depressive disorder, recurrent, moderate: Secondary | ICD-10-CM | POA: Diagnosis present

## 2022-02-02 LAB — ETHANOL: Alcohol, Ethyl (B): <10 mg/dL (ref <10)

## 2022-02-02 LAB — CBC
HCT: 40.8 % (ref 36.0–46.0)
Hemoglobin: 13.6 g/dL (ref 12.0–15.0)
MCH: 28.9 pg (ref 26.0–34.0)
MCHC: 33.3 g/dL (ref 30.0–36.0)
MCV: 86.6 fL (ref 80.0–100.0)
Platelets: 318 10*3/uL (ref 150–400)
RBC: 4.71 MIL/uL (ref 3.87–5.11)
RDW: 12.8 % (ref 11.5–15.5)
WBC: 8.5 10*3/uL (ref 4.0–10.5)
nRBC: 0 % (ref 0.0–0.2)

## 2022-02-02 LAB — COMPREHENSIVE METABOLIC PANEL
ALT: 31 U/L (ref 0–44)
AST: 26 U/L (ref 15–41)
Albumin: 3.9 g/dL (ref 3.5–5.0)
Alkaline Phosphatase: 66 U/L (ref 38–126)
Anion gap: 13 (ref 5–15)
BUN: 9 mg/dL (ref 6–20)
CO2: 27 mmol/L (ref 22–32)
Calcium: 9.4 mg/dL (ref 8.9–10.3)
Chloride: 102 mmol/L (ref 98–111)
Creatinine, Ser: 0.87 mg/dL (ref 0.44–1.00)
GFR, Estimated: >60 mL/min (ref >60)
Glucose, Bld: 106 mg/dL — ABNORMAL HIGH (ref 70–99)
Potassium: 2.8 mmol/L — ABNORMAL LOW (ref 3.5–5.1)
Sodium: 142 mmol/L (ref 135–145)
Total Bilirubin: 0.8 mg/dL (ref 0.3–1.2)
Total Protein: 7.2 g/dL (ref 6.5–8.1)

## 2022-02-02 LAB — ACETAMINOPHEN LEVEL: Acetaminophen (Tylenol), Serum: <10 ug/mL — ABNORMAL LOW (ref 10–30)

## 2022-02-02 LAB — SALICYLATE LEVEL: Salicylate Lvl: <7 mg/dL — ABNORMAL LOW (ref 7.0–30.0)

## 2022-02-02 MED ORDER — LACTATED RINGERS IV BOLUS
1000.0000 mL | Freq: Once | INTRAVENOUS | Status: AC
  Administered 2022-02-02: 1000 mL via INTRAVENOUS

## 2022-02-02 MED ORDER — POTASSIUM CHLORIDE CRYS ER 20 MEQ PO TBCR
40.0000 meq | EXTENDED_RELEASE_TABLET | Freq: Once | ORAL | Status: AC
  Administered 2022-02-03: 40 meq via ORAL
  Filled 2022-02-02: qty 2

## 2022-02-02 NOTE — ED Triage Notes (Signed)
Pt arrived via [facility] from home post ingestion of 5-6, 25mg  Benadryl and 50mg  Hydroxyzine aprox 2 hours prior to arrival to ED @ 2100. Pt c/o sleepiness. Pt denies SI and HI, sts she was in argument with daughter and wanted to sleep so she took "extra" medication to induce sleep. Per EMS, pt in route on monitor showing intermittent PVC with bradycardia present into 40's. Pt A&O x4 on arrival. MD at bedside for further evaluation.

## 2022-02-03 DIAGNOSIS — F411 Generalized anxiety disorder: Secondary | ICD-10-CM | POA: Diagnosis present

## 2022-02-03 DIAGNOSIS — F331 Major depressive disorder, recurrent, moderate: Secondary | ICD-10-CM

## 2022-02-03 LAB — URINE DRUG SCREEN, QUALITATIVE (ARMC ONLY)
Amphetamines, Ur Screen: NOT DETECTED
Barbiturates, Ur Screen: NOT DETECTED
Benzodiazepine, Ur Scrn: NOT DETECTED
Cannabinoid 50 Ng, Ur ~~LOC~~: NOT DETECTED
Cocaine Metabolite,Ur ~~LOC~~: NOT DETECTED
MDMA (Ecstasy)Ur Screen: NOT DETECTED
Methadone Scn, Ur: NOT DETECTED
Opiate, Ur Screen: NOT DETECTED
Phencyclidine (PCP) Ur S: NOT DETECTED
Tricyclic, Ur Screen: NOT DETECTED

## 2022-02-03 LAB — POTASSIUM: Potassium: 3.2 mmol/L — ABNORMAL LOW (ref 3.5–5.1)

## 2022-02-03 LAB — PREGNANCY, URINE: Preg Test, Ur: NEGATIVE

## 2022-02-03 LAB — SARS CORONAVIRUS 2 BY RT PCR: SARS Coronavirus 2 by RT PCR: NEGATIVE

## 2022-02-03 MED ORDER — CALCIUM CARBONATE ANTACID 500 MG PO CHEW
1.0000 | CHEWABLE_TABLET | Freq: Once | ORAL | Status: AC
  Administered 2022-02-03: 200 mg via ORAL
  Filled 2022-02-03: qty 1

## 2022-02-03 MED ORDER — PAROXETINE HCL 30 MG PO TABS
30.0000 mg | ORAL_TABLET | Freq: Once | ORAL | Status: DC
  Filled 2022-02-03: qty 1

## 2022-02-03 MED ORDER — PAROXETINE HCL 30 MG PO TABS: 30.0000 mg | ORAL_TABLET | Freq: Every morning | ORAL | Status: DC

## 2022-02-03 MED ORDER — PANTOPRAZOLE SODIUM 40 MG PO TBEC
40.0000 mg | DELAYED_RELEASE_TABLET | Freq: Once | ORAL | Status: AC
  Administered 2022-02-03: 40 mg via ORAL
  Filled 2022-02-03: qty 1

## 2022-02-03 MED ORDER — POTASSIUM CHLORIDE CRYS ER 20 MEQ PO TBCR
40.0000 meq | EXTENDED_RELEASE_TABLET | Freq: Two times a day (BID) | ORAL | Status: DC
  Administered 2022-02-03: 40 meq via ORAL
  Filled 2022-02-03: qty 2

## 2022-02-03 NOTE — ED Notes (Signed)
Pt's brother's number: Jimmy H. [phone].

## 2022-02-03 NOTE — ED Provider Notes (Signed)
   7:24 AM Patient was requesting to leave.  Explained to patient that psychiatry recommended inpatient overnight time but given she states that she took 5 Benadryl because she wanted help sleeping due to having an argument with her family member and not having SI we will have psychiatry reevaluate her at this morning.  If patient tries to leave prior to being seen by psychiatry we will do IVC but given patient is stating that she is willing to wait and be evaluated by psychiatry will be voluntary at this time.  Patient does report a history of reflux.  She reports having her typical reflux symptoms and is requesting something to help.  We will provide a Tums.  She had an EKG when she got here that was reassuring.  When asked if she was having any pain anywhere she denied any pain and just talked about her chronic back pain that she is getting spinal injections for.  She denied any chest pain.  Psychiatry is going to come down to evaluate patient.   Vanessa [place], MD 02/03/22 [phone]
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
## 2022-02-03 NOTE — ED Notes (Signed)
Messaged pharm that pt pending d/c so can offer paxil to pt before pt leaves. Awaiting completion of d/c paperwork. Pt's ride to call this RN per psych team.

## 2022-02-03 NOTE — ED Notes (Signed)
Pt educated about IVC paperwork, pt continues to refuse blood work at this time. Radonna Ricker, RN notified.

## 2022-02-03 NOTE — ED Notes (Addendum)
Pt changed out into hospital required burgundy scrubs and pts personal belonging placed into belongings bag with pt label on outside. Writer and [place] EDT in room during change out   Belonging Include: (((1 BAG)))  1 yellow shirt 1 blue/white/gray pant 1 blue brief 1 white bra 2 pink shoes 1 black/brown bag  Pt to remain with black trimmed eyewear

## 2022-02-03 NOTE — ED Notes (Signed)
Refusing repeat labwork.
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
## 2022-02-03 NOTE — Discharge Instructions (Signed)
You have been seen in the emergency department for a  psychiatric concern. You have been evaluated both medically as well as psychiatrically. Please follow-up with your outpatient resources provided. Return to the emergency department for any worsening symptoms, or any thoughts of hurting yourself or anyone else so that we may attempt to help you. 

## 2022-02-03 NOTE — BH Assessment (Signed)
Referral information for [facility] Psychiatric Hospitalization faxed to;   [facility] ([phone]- [phone]),   [facility] [phone]  Rosana Hoes [phone]),  [address] [phone]),   [place] [phone])  Boy[facility] Nearing [phone] or [phone]),   [facility]ela Ports [phone]).

## 2022-02-03 NOTE — ED Notes (Signed)
Pt refused a set of vital signs. Pt aware awaiting d/c paperwork. Collecting pt's belongings bag now.

## 2022-02-03 NOTE — ED Provider Notes (Signed)
[facility] Provider Note    Event Date/Time   First MD Initiated Contact with Patient 02/02/22 2303     (approximate)   History   Drug Overdose   HPI  Linda Parsons is a 56 y.o. female who presents to the ED for evaluation of Drug Overdose   I review a [facility] PCP visit from May.  Obese patient with history of lumbar stenosis, HTN and anxiety.  Patient presents to the ED via EMS from home for evaluation of overdose and "weird thoughts."  Patient reports taking 5-6 tablets of 25 mg Benadryl as well as 50 mg of hydroxyzine "just to help me sleep."  Denies the desire to die or suicidal thoughts, but has difficulty elaborating upon "weird thoughts."  She reports getting into a verbal argument with family and having a lot of home psychosocial stressors that put her over the edge to do this to try to get some sleep tonight.  Physical Exam   Triage Vital Signs: ED Triage Vitals  Enc Vitals Group     BP 02/02/22 2308 121/88     Pulse Rate 02/02/22 2308 81     Resp 02/02/22 2308 17     Temp 02/02/22 2308 98.4 F (36.9 C)     Temp Source 02/02/22 2308 Oral     SpO2 02/02/22 2308 96 %     Weight 02/02/22 2311 209 lb 7 oz (95 kg)     Height 02/02/22 2311 5\' 6"  (1.676 m)     Head Circumference --      Peak Flow --      Pain Score --      Pain Loc --      Pain Edu? --      Excl. in [place]? --     Most recent vital signs: Vitals:   02/02/22 2308 02/03/22 0000  BP: 121/88 118/77  Pulse: 81 71  Resp: 17 17  Temp: 98.4 F (36.9 C)   SpO2: 96% 94%    General: Awake, no distress.  Oriented without noted encephalopathy.  Follows commands.  No clonus or rigidity. CV:  Good peripheral perfusion.  Resp:  Normal effort.  Abd:  No distention.  MSK:  No deformity noted.  Neuro:  No focal deficits appreciated. Other:     ED Results / Procedures / Treatments   Labs (all labs ordered are listed, but only abnormal results are displayed) Labs Reviewed   COMPREHENSIVE METABOLIC PANEL - Abnormal; Notable for the following components:      Result Value   Potassium 2.8 (*)    Glucose, Bld 106 (*)    All other components within normal limits  SALICYLATE LEVEL - Abnormal; Notable for the following components:   Salicylate Lvl <3.9 (*)    All other components within normal limits  ACETAMINOPHEN LEVEL - Abnormal; Notable for the following components:   Acetaminophen (Tylenol), Serum <10 (*)    All other components within normal limits  ETHANOL  CBC  URINE DRUG SCREEN, QUALITATIVE ([facility] ONLY)  POC URINE PREG, ED    EKG Sinus rhythm with sinus arrhythmia, rate of 83 bpm.  Normal axis and QTc is 510.  No clear acute ischemic features. EKG just prior to this is a sinus rhythm with sinus arrhythmia, rate of 78 bpm.  Normal axis.  QTc 452.  1 PVC.  No STEMI.  RADIOLOGY   Official radiology report(s): No results found.  PROCEDURES and INTERVENTIONS:  .1-3 Lead EKG Interpretation  Performed by: Delton Prairie, MD Authorized by: Delton Prairie, MD     Interpretation: normal     ECG rate:  70   ECG rate assessment: normal     Rhythm: sinus rhythm     Ectopy: PAC     Conduction: normal   .Critical Care  Performed by: Delton Prairie, MD Authorized by: Delton Prairie, MD   Critical care provider statement:    Critical care time (minutes):  30   Critical care time was exclusive of:  Separately billable procedures and treating other patients   Critical care was necessary to treat or prevent imminent or life-threatening deterioration of the following conditions:  Toxidrome   Critical care was time spent personally by me on the following activities:  Development of treatment plan with patient or surrogate, discussions with consultants, evaluation of patient's response to treatment, examination of patient, ordering and review of laboratory studies, ordering and review of radiographic studies, ordering and performing treatments and interventions,  pulse oximetry, re-evaluation of patient's condition and review of old charts   Medications  potassium chloride SA (KLOR-CON M) CR tablet 40 mEq (has no administration in time range)  lactated ringers bolus 1,000 mL (1,000 mLs Intravenous New Bag/Given 02/02/22 2327)     IMPRESSION / MDM / ASSESSMENT AND PLAN / ED COURSE  I reviewed the triage vital signs and the nursing notes.  Differential diagnosis includes, but is not limited to, anticholinergic toxidrome, polysubstance abuse, suicidal, malingering,  {Patient presents with symptoms of an acute illness or injury that is potentially life-threatening.  56 year old female presents to the ED after taking a few extra antihistamines without clear evidence of anticholinergic toxidrome requiring psychiatric evaluation and likely inpatient stay with them.  She is hemodynamically stable without particular toxidromes.  She was noted to have sinus arrhythmia on the monitor and variable QTc for which she will stay on the monitor.  Provided IV fluids.  Noted to be hypokalemic and repleted orally.  Normal CBC, salicylate, acetaminophen and ethanol levels.  Consulted with psychiatry who recommends inpatient stay, which I think is reasonable.  She is agreeable to stay no clear indications to IVC at this point, but if she were to try to leave I believe IVC would be appropriate.  Clinical Course as of 02/03/22 0032  Thu Feb 03, 2022  0016 Reassessed. Psych at the bedside [DS]  0027 Psych recommending inpatient [DS]  0030 The patient has been placed in psychiatric observation due to the need to provide a safe environment for the patient while obtaining psychiatric consultation and evaluation, as well as ongoing medical and medication management to treat the patient's condition.  The patient has not been placed under full IVC at this time.   [DS]    Clinical Course User Index [DS] Delton Prairie, MD     FINAL CLINICAL IMPRESSION(S) / ED DIAGNOSES   Final  diagnoses:  Overdose of undetermined intent, initial encounter  Hypokalemia     Rx / DC Orders   ED Discharge Orders     None        Note:  This document was prepared using Dragon voice recognition software and may include unintentional dictation errors.   Delton Prairie, MD 02/03/22 [phone]

## 2022-02-03 NOTE — ED Notes (Signed)
Paxil not yet received from pharm.

## 2022-02-03 NOTE — ED Notes (Signed)
Pt's son coming to be ride. Pt requested to be taken to lobby in a wheelchair though pt has been steady on feet today. Wheeled to lobby as requested.
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
## 2022-02-03 NOTE — ED Notes (Signed)
Pt did not make it to treatment area, will not be assuming care of this pt. Lorriane Shire, RN is now with pt

## 2022-02-03 NOTE — ED Notes (Signed)
Pt to room 21. Garage door is up and pt is on monitor. Pt does not speak when nurse speaks to her or look at nurse.

## 2022-02-03 NOTE — Consult Note (Signed)
[facility] Face-to-Face Psychiatry Consult   Reason for Consult: Drug Overdose Referring Physician:  Dr. Katrinka Blazing Patient Identification: Linda Parsons MRN:  [id number] Principal Diagnosis: <principal problem not specified> Diagnosis:  Active Problems:   MDD (major depressive disorder), recurrent episode, moderate (HCC)   GAD (generalized anxiety disorder)   Total Time spent with patient: 1 hour  Subjective: "My daughters are mean to me. They don't treat me good." Linda Parsons is a 56 y.o. female patient presented to [facility] voluntarily due to patient over taking too much Benadryl. Per the ED triage nurses note, Pt arrived via [facility] from home post ingestion of 5-6, 25mg  Benadryl and 50mg  Hydroxyzine aprox 2 hours prior to arrival to ED @ 2100. Pt c/o sleepiness. Pt denies SI and HI, sts she was in argument with daughter and wanted to sleep so she took "extra" medication to induce sleep. Per EMS, pt in route on monitor showing intermittent PVC with bradycardia present into 40's. Pt A&O x4 on arrival. MD at bedside for further evaluation.   The patient voiced family dynamics, which are stressing her out. The patient denies wanting to harm herself. The patient shared that she wanted to sleep and took more medications. This provider saw The patient face-to-face; the chart was reviewed, and consulted with Dr.Smith on 02/03/2022 due to the patient's care. It was discussed with the EDP that the patient does meet the criteria to be admitted to the psychiatric inpatient unit.  On evaluation, the patient is alert and oriented x 4, depressed but calm, cooperative, and mood-congruent with affect. The patient does not appear to be responding to internal or external stimuli. Neither is the patient presenting with any delusional thinking. The patient denies auditory or visual hallucinations. The patient denies any suicidal, homicidal, or self-harm ideations. The patient is not presenting with any psychotic or  paranoid behaviors. During an encounter with the patient, she could answer questions appropriately.  HPI: Per Dr. 2101, Linda Parsons is a 57 y.o. female who presents to the ED for evaluation of Drug Overdose I review a [facility] PCP visit from May.  Obese patient with history of lumbar stenosis, HTN and anxiety. Patient presents to the ED via EMS from home for evaluation of overdose and "weird thoughts."  Patient reports taking 5-6 tablets of 25 mg Benadryl as well as 50 mg of hydroxyzine "just to help me sleep."  Denies the desire to die or suicidal thoughts, but has difficulty elaborating upon "weird thoughts."  She reports getting into a verbal argument with family and having a lot of home psychosocial stressors that put her over the edge to do this to try to get some sleep tonight.  Past Psychiatric History:  Anxiety  Risk to Self:   Risk to Others:   Prior Inpatient Therapy:   Prior Outpatient Therapy:    Past Medical History:  Past Medical History:  Diagnosis Date   Anxiety    Hypertension    Thyroid disease     Past Surgical History:  Procedure Laterality Date   WRIST SURGERY Left    Family History: History reviewed. No pertinent family history. Family Psychiatric  History: History reviewed. No pertinent family psychiatric history  Social History:  Social History   Substance and Sexual Activity  Alcohol Use No     Social History   Substance and Sexual Activity  Drug Use Not on file    Social History   Socioeconomic History   Marital status: Divorced    Spouse name:  Not on file   Number of children: Not on file   Years of education: Not on file   Highest education level: Not on file  Occupational History   Not on file  Tobacco Use   Smoking status: Never   Smokeless tobacco: Never  Substance and Sexual Activity   Alcohol use: No   Drug use: Not on file   Sexual activity: Not on file  Other Topics Concern   Not on file  Social History Narrative   Not on  file   Social Determinants of Health   Financial Resource Strain: Not on file  Food Insecurity: Not on file  Transportation Needs: Not on file  Physical Activity: Not on file  Stress: Not on file  Social Connections: Not on file   Additional Social History:    Allergies:   Allergies  Allergen Reactions   Latex Rash   Penicillins Rash    Labs:  Results for orders placed or performed during the hospital encounter of 02/02/22 (from the past 48 hour(s))  Comprehensive metabolic panel     Status: Abnormal   Collection Time: 02/02/22 11:22 PM  Result Value Ref Range   Sodium 142 135 - 145 mmol/L   Potassium 2.8 (L) 3.5 - 5.1 mmol/L   Chloride 102 98 - 111 mmol/L   CO2 27 22 - 32 mmol/L   Glucose, Bld 106 (H) 70 - 99 mg/dL    Comment: Glucose reference range applies only to samples taken after fasting for at least 8 hours.   BUN 9 6 - 20 mg/dL   Creatinine, Ser 0.97 0.44 - 1.00 mg/dL   Calcium 9.4 8.9 - 35.3 mg/dL   Total Protein 7.2 6.5 - 8.1 g/dL   Albumin 3.9 3.5 - 5.0 g/dL   AST 26 15 - 41 U/L   ALT 31 0 - 44 U/L   Alkaline Phosphatase 66 38 - 126 U/L   Total Bilirubin 0.8 0.3 - 1.2 mg/dL   GFR, Estimated >29 >92 mL/min    Comment: (NOTE) Calculated using the CKD-EPI Creatinine Equation (2021)    Anion gap 13 5 - 15    Comment: Performed at [facility], [address] [place], [place] [address]  Ethanol     Status: None   Collection Time: 02/02/22 11:22 PM  Result Value Ref Range   Alcohol, Ethyl (B) <10 <10 mg/dL    Comment: (NOTE) Lowest detectable limit for serum alcohol is 10 mg/dL.  For medical purposes only. Performed at [facility], [address] [place], [place] [address]   Salicylate level     Status: Abnormal   Collection Time: 02/02/22 11:22 PM  Result Value Ref Range   Salicylate Lvl <7.0 (L) 7.0 - 30.0 mg/dL    Comment: Performed at [facility], [address] [place], [place] [address]  Acetaminophen level      Status: Abnormal   Collection Time: 02/02/22 11:22 PM  Result Value Ref Range   Acetaminophen (Tylenol), Serum <10 (L) 10 - 30 ug/mL    Comment: (NOTE) Therapeutic concentrations vary significantly. A range of 10-30 ug/mL  may be an effective concentration for many patients. However, some  are best treated at concentrations outside of this range. Acetaminophen concentrations >150 ug/mL at 4 hours after ingestion  and >50 ug/mL at 12 hours after ingestion are often associated with  toxic reactions.  Performed at [facility], [address] [place], [place] [address]   cbc  Status: None   Collection Time: 02/02/22 11:22 PM  Result Value Ref Range   WBC 8.5 4.0 - 10.5 K/uL   RBC 4.71 3.87 - 5.11 MIL/uL   Hemoglobin 13.6 12.0 - 15.0 g/dL   HCT 40.8 36.0 - 46.0 %   MCV 86.6 80.0 - 100.0 fL   MCH 28.9 26.0 - 34.0 pg   MCHC 33.3 30.0 - 36.0 g/dL   RDW 12.8 11.5 - 15.5 %   Platelets 318 150 - 400 K/uL   nRBC 0.0 0.0 - 0.2 %    Comment: Performed at [facility], [address] [place], [place] [address]    No current facility-administered medications for this encounter.   Current Outpatient Medications  Medication Sig Dispense Refill   hydrochlorothiazide (HYDRODIURIL) 12.5 MG tablet Take 12.5 mg by mouth daily.     levothyroxine (SYNTHROID, LEVOTHROID) 88 MCG tablet Take 88 mcg by mouth daily before breakfast.     ondansetron (ZOFRAN ODT) 4 MG disintegrating tablet Take 1 tablet (4 mg total) by mouth every 8 (eight) hours as needed for nausea or vomiting. 20 tablet 0   predniSONE (DELTASONE) 20 MG tablet Take 40 mg by mouth daily for 3 days, then 20mg  by mouth daily for 3 days, then 10mg  daily for 3 days 12 tablet 0   sertraline (ZOLOFT) 50 MG tablet Take 50 mg by mouth daily.      Musculoskeletal: Strength & Muscle Tone: within normal limits Gait & Station: normal Patient leans: N/A Psychiatric Specialty Exam:  Presentation  General Appearance:  Appropriate for Environment  Eye Contact:Good  Speech:Clear and Coherent  Speech Volume:Normal  Handedness:Right   Mood and Affect  Mood:Depressed; Hopeless  Affect:Blunt; Depressed   Thought Process  Thought Processes:Coherent  Descriptions of Associations:Intact  Orientation:Full (Time, Place and Person)  Thought Content:Logical  History of Schizophrenia/Schizoaffective disorder:No data recorded Duration of Psychotic Symptoms:No data recorded Hallucinations:Hallucinations: None  Ideas of Reference:None  Suicidal Thoughts:Suicidal Thoughts: No  Homicidal Thoughts:Homicidal Thoughts: No   Sensorium  Memory:Immediate Good; Recent Good; Remote Good  Judgment:Poor  Insight:Poor   Executive Functions  Concentration:Good  Attention Span:Good  Recall:Good  Fund of Knowledge:Good  Language:Fair   Psychomotor Activity  Psychomotor Activity:Psychomotor Activity: Normal   Assets  Assets:Communication Skills; Desire for Improvement; Physical Health; Resilience; Social Support   Sleep  Sleep:Sleep: Fair   Physical Exam: Physical Exam Vitals and nursing note reviewed.  Constitutional:      Appearance: Normal appearance.  HENT:     Head: Normocephalic and atraumatic.     Right Ear: External ear normal.     Left Ear: External ear normal.     Nose: Nose normal.     Mouth/Throat:     Mouth: Mucous membranes are moist.  Cardiovascular:     Rate and Rhythm: Normal rate.     Pulses: Normal pulses.  Pulmonary:     Effort: Pulmonary effort is normal.  Musculoskeletal:        General: Normal range of motion.     Cervical back: Normal range of motion and neck supple.  Neurological:     General: No focal deficit present.     Mental Status: She is alert and oriented to person, place, and time. Mental status is at baseline.  Psychiatric:        Attention and Perception: Attention and perception normal.        Mood and Affect: Mood is anxious and  depressed. Affect is blunt.  Speech: Speech normal.        Behavior: Behavior is cooperative.        Thought Content: Thought content normal.        Cognition and Memory: Cognition and memory normal.        Judgment: Judgment is impulsive and inappropriate.    Review of Systems  Psychiatric/Behavioral:  Positive for depression. The patient is nervous/anxious and has insomnia.   All other systems reviewed and are negative.  Blood pressure 118/77, pulse 71, temperature 98.4 F (36.9 C), temperature source Oral, resp. rate 17, height 5\' 6"  (1.676 m), weight 95 kg, SpO2 94 %. Body mass index is 33.8 kg/m.  Treatment Plan Summary: Plan Patient does meet criteria for psychiatric inpatient admission  Disposition: Recommend psychiatric Inpatient admission when medically cleared. Supportive therapy provided about ongoing stressors.   Gillermo MurdochJacqueline Ashland Osmer, NP 02/03/2022 1:39 AM

## 2022-02-03 NOTE — ED Notes (Signed)
Pt given her belongings bag. Pt changing into personal clothes.

## 2022-02-03 NOTE — ED Notes (Signed)
Report received from [place], [place], pt to be moved to room 21 from ED

## 2022-02-03 NOTE — ED Notes (Signed)
Provider Clapacs at bedside with interpreter.

## 2022-02-03 NOTE — Consult Note (Signed)
Psychiatry: This is a follow-up consultation by psychiatry.  This is a 56 year old woman brought into the hospital after taking what was reported to be an overdose of Benadryl or hydroxyzine.  Patient has insisted since coming here that there was no suicidal intent to this.  On interview today using a Spanish language interpreter the patient continues to state she had no suicidal intent.  She states that she took 2 or 3 of her sleeping pills because she was upset about interactions with her daughters but that she was only wanting to sleep.  Patient says she has chronic anxiety and dysphoria but never has thoughts about killing herself and the reasons not to are her care about her family, positive things in her life and her religion.  Patient was tearful but reactive.  Showed no signs of psychosis.  She states that she has ongoing treatment through [facility] psychiatry and will continue to follow up with them and is not abusing drugs or alcohol.  At this point the patient is strongly requesting discharge.  She appears unlikely to benefit from inpatient treatment.  Patient was educated about the dangers of overuse of anticholinergic medicines and strongly encouraged not to take more than was prescribed to which she agreed.  Encourage patient to follow up with her usual outpatient psychiatric providers.  At this point I think she no longer meets commitment criteria and have discontinued the IVC.  Case reviewed with nurse practitioner and staff in the emergency room.

## 2022-02-03 NOTE — ED Notes (Signed)
Pt signed printed d/c paperwork as topaz unavailable.

## 2022-02-03 NOTE — ED Notes (Signed)
Psychiatry at bedside.

## 2022-02-03 NOTE — ED Notes (Signed)
Refused lunch

## 2022-02-03 NOTE — ED Notes (Signed)
Pt in tears complaining about back pain stating she has issues with her back. Pt offered pain meds, but refusing at this time. Pt states, "I don't want any of the medicine from this hospital I want my medicine from [facility] that's where my care is at, so take me there instead." Pt informed that at this moment she is IVC and will remain in this ED until they make a decision about her care. Metta Clines, RN notified of this.

## 2022-02-03 NOTE — ED Notes (Signed)
Hospital meal provided, pt tolerated w/o complaints.  Waste discarded appropriately.  

## 2022-04-18 ENCOUNTER — Emergency Department
Admission: EM | Admit: 2022-04-18 | Discharge: 2022-04-18 | Discharge status: Against Medical Advice | Payer: Medicaid Other | Attending: Emergency Medicine | Admitting: Emergency Medicine

## 2022-04-18 ENCOUNTER — Emergency Department: Payer: Medicaid Other

## 2022-04-18 DIAGNOSIS — R0602 Shortness of breath: Secondary | ICD-10-CM | POA: Diagnosis not present

## 2022-04-18 DIAGNOSIS — R079 Chest pain, unspecified: Secondary | ICD-10-CM | POA: Insufficient documentation

## 2022-04-18 DIAGNOSIS — Z5321 Procedure and treatment not carried out due to patient leaving prior to being seen by health care provider: Secondary | ICD-10-CM | POA: Insufficient documentation

## 2022-04-18 LAB — COMPREHENSIVE METABOLIC PANEL
ALT: 40 U/L (ref 0–44)
AST: 30 U/L (ref 15–41)
Albumin: 4.1 g/dL (ref 3.5–5.0)
Alkaline Phosphatase: 75 U/L (ref 38–126)
Anion gap: 9 (ref 5–15)
BUN: 10 mg/dL (ref 6–20)
CO2: 28 mmol/L (ref 22–32)
Calcium: 9 mg/dL (ref 8.9–10.3)
Chloride: 102 mmol/L (ref 98–111)
Creatinine, Ser: 0.84 mg/dL (ref 0.44–1.00)
GFR, Estimated: >60 mL/min (ref >60)
Glucose, Bld: 103 mg/dL — ABNORMAL HIGH (ref 70–99)
Potassium: 3 mmol/L — ABNORMAL LOW (ref 3.5–5.1)
Sodium: 139 mmol/L (ref 135–145)
Total Bilirubin: 1 mg/dL (ref 0.3–1.2)
Total Protein: 7.8 g/dL (ref 6.5–8.1)

## 2022-04-18 LAB — CBC WITH DIFFERENTIAL/PLATELET
Abs Immature Granulocytes: 0.05 10*3/uL (ref 0.00–0.07)
Basophils Absolute: 0.1 10*3/uL (ref 0.0–0.1)
Basophils Relative: 1 %
Eosinophils Absolute: 0.2 10*3/uL (ref 0.0–0.5)
Eosinophils Relative: 2 %
HCT: 45 % (ref 36.0–46.0)
Hemoglobin: 14.8 g/dL (ref 12.0–15.0)
Immature Granulocytes: 1 %
Lymphocytes Relative: 21 %
Lymphs Abs: 2.1 10*3/uL (ref 0.7–4.0)
MCH: 28.6 pg (ref 26.0–34.0)
MCHC: 32.9 g/dL (ref 30.0–36.0)
MCV: 86.9 fL (ref 80.0–100.0)
Monocytes Absolute: 0.5 10*3/uL (ref 0.1–1.0)
Monocytes Relative: 5 %
Neutro Abs: 7.5 10*3/uL (ref 1.7–7.7)
Neutrophils Relative %: 70 %
Platelets: 365 10*3/uL (ref 150–400)
RBC: 5.18 MIL/uL — ABNORMAL HIGH (ref 3.87–5.11)
RDW: 13.2 % (ref 11.5–15.5)
WBC: 10.4 10*3/uL (ref 4.0–10.5)
nRBC: 0 % (ref 0.0–0.2)

## 2022-04-18 LAB — TROPONIN I (HIGH SENSITIVITY): Troponin I (High Sensitivity): 9 ng/L (ref <18)

## 2022-04-18 NOTE — ED Notes (Signed)
Iv in place by EMS. Removed prior to pt departure from lobby. Cath intact, pressure dressing applied. No bleeding noted at site
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
## 2022-07-02 ENCOUNTER — Observation Stay
Admission: EM | Admit: 2022-07-02 | Discharge: 2022-07-03 | Disposition: A | Discharge status: Home / Self Care | Payer: Medicaid Other | Attending: Internal Medicine | Admitting: Internal Medicine

## 2022-07-02 ENCOUNTER — Observation Stay: Payer: Medicaid Other

## 2022-07-02 ENCOUNTER — Emergency Department: Payer: Medicaid Other

## 2022-07-02 ENCOUNTER — Other Ambulatory Visit: Payer: Self-pay

## 2022-07-02 DIAGNOSIS — E039 Hypothyroidism, unspecified: Secondary | ICD-10-CM | POA: Insufficient documentation

## 2022-07-02 DIAGNOSIS — F411 Generalized anxiety disorder: Secondary | ICD-10-CM | POA: Diagnosis present

## 2022-07-02 DIAGNOSIS — R42 Dizziness and giddiness: Secondary | ICD-10-CM | POA: Diagnosis present

## 2022-07-02 DIAGNOSIS — Z79899 Other long term (current) drug therapy: Secondary | ICD-10-CM | POA: Insufficient documentation

## 2022-07-02 DIAGNOSIS — Z91040 Latex allergy status: Secondary | ICD-10-CM | POA: Insufficient documentation

## 2022-07-02 DIAGNOSIS — I10 Essential (primary) hypertension: Secondary | ICD-10-CM | POA: Diagnosis not present

## 2022-07-02 LAB — CBC WITH DIFFERENTIAL/PLATELET
Abs Immature Granulocytes: 0.04 10*3/uL (ref 0.00–0.07)
Basophils Absolute: 0 10*3/uL (ref 0.0–0.1)
Basophils Relative: 1 %
Eosinophils Absolute: 0.1 10*3/uL (ref 0.0–0.5)
Eosinophils Relative: 2 %
HCT: 41.5 % (ref 36.0–46.0)
Hemoglobin: 13.9 g/dL (ref 12.0–15.0)
Immature Granulocytes: 1 %
Lymphocytes Relative: 22 %
Lymphs Abs: 1.6 10*3/uL (ref 0.7–4.0)
MCH: 28.9 pg (ref 26.0–34.0)
MCHC: 33.5 g/dL (ref 30.0–36.0)
MCV: 86.3 fL (ref 80.0–100.0)
Monocytes Absolute: 0.3 10*3/uL (ref 0.1–1.0)
Monocytes Relative: 5 %
Neutro Abs: 5.2 10*3/uL (ref 1.7–7.7)
Neutrophils Relative %: 69 %
Platelets: 341 10*3/uL (ref 150–400)
RBC: 4.81 MIL/uL (ref 3.87–5.11)
RDW: 13.1 % (ref 11.5–15.5)
WBC: 7.3 10*3/uL (ref 4.0–10.5)
nRBC: 0 % (ref 0.0–0.2)

## 2022-07-02 LAB — BASIC METABOLIC PANEL
Anion gap: 9 (ref 5–15)
BUN: 11 mg/dL (ref 6–20)
CO2: 28 mmol/L (ref 22–32)
Calcium: 8.7 mg/dL — ABNORMAL LOW (ref 8.9–10.3)
Chloride: 100 mmol/L (ref 98–111)
Creatinine, Ser: 0.81 mg/dL (ref 0.44–1.00)
GFR, Estimated: >60 mL/min (ref >60)
Glucose, Bld: 122 mg/dL — ABNORMAL HIGH (ref 70–99)
Potassium: 3.2 mmol/L — ABNORMAL LOW (ref 3.5–5.1)
Sodium: 137 mmol/L (ref 135–145)

## 2022-07-02 LAB — TROPONIN I (HIGH SENSITIVITY): Troponin I (High Sensitivity): 5 ng/L (ref <18)

## 2022-07-02 LAB — MAGNESIUM: Magnesium: 2.2 mg/dL (ref 1.7–2.4)

## 2022-07-02 MED ORDER — DIPHENHYDRAMINE HCL 50 MG/ML IJ SOLN
25.0000 mg | Freq: Once | INTRAMUSCULAR | Status: AC
  Administered 2022-07-02: 25 mg via INTRAVENOUS
  Filled 2022-07-02: qty 1

## 2022-07-02 MED ORDER — ONDANSETRON HCL 4 MG/2ML IJ SOLN: 4.0000 mg | Freq: Four times a day (QID) | INTRAMUSCULAR | Status: DC | PRN

## 2022-07-02 MED ORDER — ENOXAPARIN SODIUM 40 MG/0.4ML IJ SOSY: 40.0000 mg | PREFILLED_SYRINGE | INTRAMUSCULAR | Status: DC

## 2022-07-02 MED ORDER — MECLIZINE HCL 25 MG PO TABS
25.0000 mg | ORAL_TABLET | Freq: Once | ORAL | Status: AC
  Administered 2022-07-02: 25 mg via ORAL
  Filled 2022-07-02: qty 1

## 2022-07-02 MED ORDER — PAROXETINE HCL 30 MG PO TABS
30.0000 mg | ORAL_TABLET | Freq: Every morning | ORAL | Status: DC
  Administered 2022-07-03: 30 mg via ORAL
  Filled 2022-07-02: qty 1

## 2022-07-02 MED ORDER — METOCLOPRAMIDE HCL 5 MG/ML IJ SOLN
10.0000 mg | Freq: Once | INTRAMUSCULAR | Status: AC
  Administered 2022-07-02: 10 mg via INTRAVENOUS
  Filled 2022-07-02: qty 2

## 2022-07-02 MED ORDER — ONDANSETRON HCL 4 MG/2ML IJ SOLN
4.0000 mg | Freq: Once | INTRAMUSCULAR | Status: AC
  Administered 2022-07-02: 4 mg via INTRAVENOUS
  Filled 2022-07-02: qty 2

## 2022-07-02 MED ORDER — SODIUM CHLORIDE 0.9 % IV BOLUS
500.0000 mL | Freq: Once | INTRAVENOUS | Status: AC
  Administered 2022-07-02: 500 mL via INTRAVENOUS

## 2022-07-02 MED ORDER — LEVOTHYROXINE SODIUM 88 MCG PO TABS
88.0000 ug | ORAL_TABLET | Freq: Every day | ORAL | Status: DC
  Administered 2022-07-03: 88 ug via ORAL
  Filled 2022-07-02: qty 1

## 2022-07-02 MED ORDER — POTASSIUM CHLORIDE CRYS ER 20 MEQ PO TBCR
40.0000 meq | EXTENDED_RELEASE_TABLET | Freq: Once | ORAL | Status: AC
  Administered 2022-07-02: 40 meq via ORAL
  Filled 2022-07-02: qty 2

## 2022-07-02 MED ORDER — ACETAMINOPHEN 650 MG RE SUPP: 650.0000 mg | Freq: Four times a day (QID) | RECTAL | Status: DC | PRN

## 2022-07-02 MED ORDER — PREGABALIN 75 MG PO CAPS
75.0000 mg | ORAL_CAPSULE | Freq: Two times a day (BID) | ORAL | Status: DC
  Administered 2022-07-02: 75 mg via ORAL
  Filled 2022-07-02 (×2): qty 1

## 2022-07-02 MED ORDER — ONDANSETRON HCL 4 MG PO TABS: 4.0000 mg | ORAL_TABLET | Freq: Four times a day (QID) | ORAL | Status: DC | PRN

## 2022-07-02 MED ORDER — SODIUM CHLORIDE 0.9% FLUSH
3.0000 mL | Freq: Two times a day (BID) | INTRAVENOUS | Status: DC
  Administered 2022-07-02 – 2022-07-03 (×2): 3 mL via INTRAVENOUS

## 2022-07-02 MED ORDER — SODIUM CHLORIDE 0.9 % IV SOLN: 250.0000 mL | INTRAVENOUS | Status: DC | PRN

## 2022-07-02 MED ORDER — ENOXAPARIN SODIUM 60 MG/0.6ML IJ SOSY
0.5000 mg/kg | PREFILLED_SYRINGE | INTRAMUSCULAR | Status: DC
  Administered 2022-07-02: 45 mg via SUBCUTANEOUS
  Filled 2022-07-02: qty 0.6

## 2022-07-02 MED ORDER — ACETAMINOPHEN 325 MG PO TABS
650.0000 mg | ORAL_TABLET | Freq: Four times a day (QID) | ORAL | Status: DC | PRN
  Administered 2022-07-03: 650 mg via ORAL
  Filled 2022-07-02 (×2): qty 2

## 2022-07-02 MED ORDER — SODIUM CHLORIDE 0.9% FLUSH: 3.0000 mL | INTRAVENOUS | Status: DC | PRN

## 2022-07-02 MED ORDER — MECLIZINE HCL 25 MG PO TABS
25.0000 mg | ORAL_TABLET | Freq: Three times a day (TID) | ORAL | Status: DC
  Administered 2022-07-02 – 2022-07-03 (×2): 25 mg via ORAL
  Filled 2022-07-02 (×2): qty 1

## 2022-07-02 NOTE — Assessment & Plan Note (Signed)
Stable Continue paroxetine

## 2022-07-02 NOTE — H&P (Signed)
History and Physical    PatientMariko Parsons [id number] DOB: March 25, 1966 DOA: 07/02/2022 DOS: the patient was seen and examined on 07/02/2022 PCP: System, Provider Not In  Patient coming from: Home  Chief Complaint:  Chief Complaint  Patient presents with   Dizziness   HPI: Linda Parsons is a 57 y.o. female with medical history significant for anxiety disorder, hypertension, hypothyroidism who presents to the emergency room for evaluation of sudden onset dizziness which started about 5 AM on the day of admission. Patient states that she was in her usual state of health until about 5 AM this morning when she woke up to use the bathroom.  She states that she became very dizzy and lightheaded and had to lay back down.  Dizziness is worse with changes in position of her head as well as when she attempts to sit up.  She describes the room as spinning and she also has ringing in both ears.  She denies any hearing loss. She had a prior episode about a week ago that was self-limiting. Due to the persistence of her symptoms she presented to the emergency room. She complains of constipation and nausea but denies having any abdominal pain, no chest pain, no shortness of breath, no urinary symptoms, no fever, no chills, no cough, no leg swelling, no palpitations, no blurred vision no focal deficit. Abnormal labs include a potassium of 3.2 Patient had a CT scan of the head without contrast which showed asymmetric low-attenuation in the right basal ganglia may reflect remote lacunar infarct versus dilated perivascular space. She received Benadryl, meclizine and Reglan without any significant improvement in her symptoms and will be referred to observation status for further evaluation    Review of Systems: As mentioned in the history of present illness. All other systems reviewed and are negative. Past Medical History:  Diagnosis Date   Anxiety    Hypertension    Thyroid disease    Past  Surgical History:  Procedure Laterality Date   WRIST SURGERY Left    Social History:  reports that she has never smoked. She has never used smokeless tobacco. She reports that she does not drink alcohol. No history on file for drug use.  Allergies  Allergen Reactions   Latex Rash   Penicillins Rash    History reviewed. No pertinent family history.  Prior to Admission medications   Medication Sig Start Date End Date Taking? Authorizing Provider  hydrochlorothiazide (HYDRODIURIL) 12.5 MG tablet Take 12.5 mg by mouth daily.   Yes [provider]  hydrochlorothiazide (HYDRODIURIL) 25 MG tablet Take 25 mg by mouth daily. 11/22/21   [provider]  levothyroxine (SYNTHROID) 75 MCG tablet Take 75 mcg by mouth daily. 12/15/21   [provider]  levothyroxine (SYNTHROID, LEVOTHROID) 88 MCG tablet Take 88 mcg by mouth daily before breakfast. Patient not taking: Reported on 02/03/2022    [provider]  meclizine (ANTIVERT) 25 MG tablet Take 25 mg by mouth 3 (three) times daily as needed.    [provider]  ondansetron (ZOFRAN ODT) 4 MG disintegrating tablet Take 1 tablet (4 mg total) by mouth every 8 (eight) hours as needed for nausea or vomiting. Patient not taking: Reported on 02/03/2022 02/08/19   Duanne Guess, PA-C  PARoxetine (PAXIL) 30 MG tablet Take 30 mg by mouth every morning. 11/22/21   [provider]  predniSONE (DELTASONE) 20 MG tablet Take 40 mg by mouth daily for 3 days, then 49m by mouth daily for  3 days, then 52m daily for 3 days Patient not taking: Reported on 02/03/2022 02/02/16   SLarene Pickett PA-C  pregabalin (LYRICA) 75 MG capsule Take 75 mg by mouth 2 (two) times daily. 12/17/21   [provider]  sertraline (ZOLOFT) 50 MG tablet Take 50 mg by mouth daily. Patient not taking: Reported on 02/03/2022    [provider]    Physical Exam: Vitals:   07/02/22 1127 07/02/22 1200 07/02/22 1245 07/02/22 1430   BP: 125/75 (!) 141/91 129/87 (!) 121/91  Pulse: 83 82 70 72  Resp: 15 12 16 16  $ Temp: 98.1 F (36.7 C)     TempSrc: Oral     SpO2: 95% 96% 96% 98%  Weight:      Height:       Physical Exam Vitals and nursing note reviewed.  Constitutional:      Appearance: Normal appearance.  HENT:     Head: Normocephalic and atraumatic.     Nose: Nose normal.     Mouth/Throat:     Mouth: Mucous membranes are moist.  Eyes:     Conjunctiva/sclera: Conjunctivae normal.  Cardiovascular:     Rate and Rhythm: Normal rate and regular rhythm.  Pulmonary:     Effort: Pulmonary effort is normal.     Breath sounds: Normal breath sounds.  Abdominal:     General: Abdomen is flat. Bowel sounds are normal.     Palpations: Abdomen is soft.  Musculoskeletal:        General: Normal range of motion.     Cervical back: Normal range of motion and neck supple.  Skin:    General: Skin is warm and dry.  Neurological:     General: No focal deficit present.     Mental Status: She is alert and oriented to person, place, and time.  Psychiatric:        Mood and Affect: Mood normal.        Behavior: Behavior normal.     Data Reviewed: Relevant notes from primary care and specialist visits, past discharge summaries as available in EHR, including Care Everywhere. Prior diagnostic testing as pertinent to current admission diagnoses Updated medications and problem lists for reconciliation ED course, including vitals, labs, imaging, treatment and response to treatment Triage notes, nursing and pharmacy notes and ED provider's notes Notable results as noted in HPI Labs reviewed.  Sodium 137, potassium 3.2, chloride 100 glucose 122, BUN 11, creatinine 0.81, calcium 8.7, white count 7.3, hemoglobin 13.9, hematocrit 41.5, platelet count 141 Twelve-lead EKG reviewed by me shows sinus rhythm with nonspecific T wave changes in the lateral leads. There are no new results to review at this time.  Assessment and Plan: *  Vertigo Patient presents to the ER for evaluation of dizziness which started suddenly at about 5 AM and is worse with changes in position of her head as well as when she attempts to sit up. Dizziness is associated with nausea but no emesis Concern for possible BPV but patient has an abnormal CT scan of the head without contrast Will obtain MRI of the brain for further evaluation to rule out an acute infarct Treat patient with meclizine and trial of benzodiazepine if no improvement in symptoms.  Hypothyroidism Stable Continue Synthroid  Hypertension Hold hydrochlorothiazide for now since patient is normotensive  GAD (generalized anxiety disorder) Stable Continue paroxetine     Advance Care Planning:   Code Status: Full Code   Consults: None  Family Communication: Greater than  50% of time was spent discussing patient's condition and plan of care with her at the bedside.  All questions and concerns have been addressed.  She verbalizes understanding and agrees with the plan.  Severity of Illness: The appropriate patient status for this patient is OBSERVATION. Observation status is judged to be reasonable and necessary in order to provide the required intensity of service to ensure the patient's safety. The patient's presenting symptoms, physical exam findings, and initial radiographic and laboratory data in the context of their medical condition is felt to place them at decreased risk for further clinical deterioration. Furthermore, it is anticipated that the patient will be medically stable for discharge from the hospital within 2 midnights of admission.   Author: Collier Bullock, MD 07/02/2022 4:05 PM  For on call review [email or web address].

## 2022-07-02 NOTE — ED Notes (Signed)
Pt gone to MRI

## 2022-07-02 NOTE — Plan of Care (Signed)

## 2022-07-02 NOTE — ED Notes (Signed)
Transport arrived to transport patient. Pt stable at time of departure

## 2022-07-02 NOTE — Assessment & Plan Note (Signed)
Stable Continue Synthroid 

## 2022-07-02 NOTE — Assessment & Plan Note (Signed)
Hold hydrochlorothiazide for now since patient is normotensive

## 2022-07-02 NOTE — Progress Notes (Signed)
PHARMACIST - PHYSICIAN COMMUNICATION  CONCERNING:  Enoxaparin (Lovenox) for DVT Prophylaxis    RECOMMENDATION: Patient was prescribed enoxaprin 31m q24 hours for VTE prophylaxis.   Filed Weights   07/02/22 1123  Weight: 89.8 kg (198 lb)    Body mass index is 32.95 kg/m.  Estimated Creatinine Clearance: 83.8 mL/min (by C-G formula based on SCr of 0.81 mg/dL).   [place] on [place] for enoxaparin 0.545mkg TBW SQ every 24 hours based on BMI being >30.  DESCRIPTION: [facility]  Patient is now receiving enoxaparin 45 mg every 24 hours    LiVira BlancoPharmD Clinical Pharmacist  07/02/2022 3:51 PM

## 2022-07-02 NOTE — Assessment & Plan Note (Signed)
Patient presents to the ER for evaluation of dizziness which started suddenly at about 5 AM and is worse with changes in position of her head as well as when she attempts to sit up. Dizziness is associated with nausea but no emesis Concern for possible BPV but patient has an abnormal CT scan of the head without contrast Will obtain MRI of the brain for further evaluation to rule out an acute infarct Treat patient with meclizine and trial of benzodiazepine if no improvement in symptoms.

## 2022-07-02 NOTE — ED Notes (Signed)
Pt reports dizziness and states that it feels similar to when she has had vertigo in the past. States that the room is spinning and her ears are ringing. Pt also reports constipation and a possible prolapsed rectum
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
## 2022-07-02 NOTE — ED Provider Notes (Signed)
[facility] Provider Note    Event Date/Time   First MD Initiated Contact with Patient 07/02/22 1117     (approximate)   History   Dizziness   HPI  HPI obtained via in person Spanish interpreter  Terrylee Arender is a 57 y.o. female with a history of hypertension, lumbar stenosis, and anxiety who presents with dizziness, acute onset today around 5 AM, described mainly as a sensation of everything spinning around her but also with some lightheadedness.  The dizziness is severe in intensity and worse with any turning of her head or movement of her body.  It improves when she lies still.  The patient reports associated nausea but has not vomited.  She denies any chest pain or difficulty breathing.  She has no headache but does report bilateral ear ringing for the last few days.  She had 1 similar episode of dizziness about a week ago but this resolved after a few minutes on its own.  The patient also reports constipation which is chronic.  She states that she has bowel movements almost daily but they are very hard and she has to strain a lot.  Sometimes it feels like her anus is coming out.  She has some blood with bowel movements intermittently.  She is on Metamucil but no other laxatives or stool softeners.  I reviewed the past medical records.  The patient was seen in the ED in September for an apparent medication overdose with Benadryl.  I reviewed the psychiatry note from NP [facility] who recommended psychiatric inpatient admission and was subsequently reevaluated by Dr. Weber Cooks and recommended for discharge.   Physical Exam   Triage Vital Signs: ED Triage Vitals  Enc Vitals Group     BP      Pulse      Resp      Temp      Temp src      SpO2      Weight      Height      Head Circumference      Peak Flow      Pain Score      Pain Loc      Pain Edu?      Excl. in [place]?     Most recent vital signs: Vitals:   07/02/22 1245 07/02/22 1430  BP: 129/87  (!) 121/91  Pulse: 70 72  Resp: 16 16  Temp:    SpO2: 96% 98%     General: Alert and oriented, no distress. CV:  Good peripheral perfusion.  Resp:  Normal effort.  Abd:  Soft and nontender.  No distention.  Other:  EOMI.  PERRLA.  No facial droop.  Motor intact in all extremities.  No ataxia on finger-to-nose.  No pronator drift.  Bilateral TMs and ear canals appear normal.   ED Results / Procedures / Treatments   Labs (all labs ordered are listed, but only abnormal results are displayed) Labs Reviewed  BASIC METABOLIC PANEL - Abnormal; Notable for the following components:      Result Value   Potassium 3.2 (*)    Glucose, Bld 122 (*)    Calcium 8.7 (*)    All other components within normal limits  CBC WITH DIFFERENTIAL/PLATELET  TROPONIN I (HIGH SENSITIVITY)     EKG  ED ECG REPORT I, Arta Silence, the attending physician, personally viewed and interpreted this ECG.  Date: 07/02/2022 EKG Time: 1126 Rate: 78 Rhythm: normal sinus rhythm QRS Axis:  normal Intervals: normal ST/T Wave abnormalities: Nonspecific T wave abnormalities Narrative Interpretation: no evidence of acute ischemia    RADIOLOGY  CT head: I independently viewed and interpreted the images; there is no ICH.  Radiology report indicates no acute abnormalities.  There is a possible remote lacunar infarct.   PROCEDURES:  Critical Care performed: No  Procedures   MEDICATIONS ORDERED IN ED: Medications  sodium chloride 0.9 % bolus 500 mL (0 mLs Intravenous Stopped 07/02/22 1452)  ondansetron (ZOFRAN) injection 4 mg (4 mg Intravenous Given 07/02/22 1209)  meclizine (ANTIVERT) tablet 25 mg (25 mg Oral Given 07/02/22 1209)  diphenhydrAMINE (BENADRYL) injection 25 mg (25 mg Intravenous Given 07/02/22 1450)  metoCLOPramide (REGLAN) injection 10 mg (10 mg Intravenous Given 07/02/22 1450)     IMPRESSION / MDM / ASSESSMENT AND PLAN / ED COURSE  I reviewed the triage vital signs and the nursing  notes.  57 year old female with PMH as noted above presents with acute onset this morning of dizziness, described mostly as spinning/vertigo.  She has had bilateral ear ringing for approximately 1 week.  She also reports subacute to chronic constipation and anorectal pain.  Physical exam is unremarkable for acute findings.  Differential diagnosis includes, but is not limited to, peripheral vertigo, likely labyrinthitis or BPPV, less likely central etiology.  The patient's neurologic exam is nonfocal.  We will obtain CT head and lab workup, give fluids, meclizine, Zofran, and reassess.  The patient also reports chronic constipation and anal rectal pain.  We will perform an anorectal exam and likely prescribe the patient a stool softener.  She is not currently on any medication for this besides Metamucil.  Patient's presentation is most consistent with acute presentation with potential threat to life or bodily function.  The patient is on the cardiac monitor to evaluate for evidence of arrhythmia and/or significant heart rate changes.  ----------------------------------------- 3:41 PM on 07/02/2022 -----------------------------------------  CT is negative for acute findings.  Labs are unremarkable with normal electrolytes except for borderline low potassium.  There is no leukocytosis.  Troponin is negative.  The patient had minimal improvement with the meclizine.  I gave Reglan and Benadryl with some improvement although the patient still has significant vertigo when turning her head or trying to move.  Therefore, she will need admission for further workup and management.  I consulted Dr. Francine Graven from the hospitalist service; based on her discussion she agrees to admit the patient.   FINAL CLINICAL IMPRESSION(S) / ED DIAGNOSES   Final diagnoses:  Vertigo     Rx / DC Orders   ED Discharge Orders     None        Note:  This document was prepared using Dragon voice recognition software  and may include unintentional dictation errors.    Arta Silence, MD 07/02/22 [phone]

## 2022-07-02 NOTE — ED Notes (Signed)
ED Provider at bedside. 

## 2022-07-03 DIAGNOSIS — R42 Dizziness and giddiness: Secondary | ICD-10-CM | POA: Diagnosis not present

## 2022-07-03 LAB — BASIC METABOLIC PANEL
Anion gap: 4 — ABNORMAL LOW (ref 5–15)
BUN: 9 mg/dL (ref 6–20)
CO2: 32 mmol/L (ref 22–32)
Calcium: 8.7 mg/dL — ABNORMAL LOW (ref 8.9–10.3)
Chloride: 105 mmol/L (ref 98–111)
Creatinine, Ser: 0.91 mg/dL (ref 0.44–1.00)
GFR, Estimated: >60 mL/min (ref >60)
Glucose, Bld: 113 mg/dL — ABNORMAL HIGH (ref 70–99)
Potassium: 4.2 mmol/L (ref 3.5–5.1)
Sodium: 141 mmol/L (ref 135–145)

## 2022-07-03 LAB — CBC
HCT: 40.6 % (ref 36.0–46.0)
Hemoglobin: 13.4 g/dL (ref 12.0–15.0)
MCH: 28.9 pg (ref 26.0–34.0)
MCHC: 33 g/dL (ref 30.0–36.0)
MCV: 87.7 fL (ref 80.0–100.0)
Platelets: 309 10*3/uL (ref 150–400)
RBC: 4.63 MIL/uL (ref 3.87–5.11)
RDW: 13.2 % (ref 11.5–15.5)
WBC: 7 10*3/uL (ref 4.0–10.5)
nRBC: 0 % (ref 0.0–0.2)

## 2022-07-03 LAB — HIV ANTIBODY (ROUTINE TESTING W REFLEX): HIV Screen 4th Generation wRfx: NONREACTIVE

## 2022-07-03 MED ORDER — MECLIZINE HCL 25 MG PO TABS: 25.0000 mg | ORAL_TABLET | Freq: Three times a day (TID) | ORAL | 0 refills | Status: AC | PRN

## 2022-07-03 MED ORDER — LEVOTHYROXINE SODIUM 50 MCG PO TABS: 75.0000 ug | ORAL_TABLET | Freq: Every day | ORAL | Status: DC

## 2022-07-03 NOTE — Plan of Care (Signed)

## 2022-07-03 NOTE — Discharge Summary (Signed)
Physician Discharge Summary   Patient: Linda Parsons MRN: [id number] DOB: [birth year]  Admit date:     07/02/2022  Discharge date: 07/03/22  Discharge Physician: Fritzi Mandes   PCP: System, Provider Not In   Recommendations at discharge:    F/U  [facility] on your appt next Tuesday Pt to call and make appt with Dr Pryor Ochoa ENT for f/u vertigo and chronic sinus congestion/drainage  Discharge Diagnoses: Principal Problem:   Vertigo Active Problems:   GAD (generalized anxiety disorder)   Hypertension   Hypothyroidism   Linda Parsons is a 57 y.o. female with medical history significant for anxiety disorder, hypertension, hypothyroidism who presents to the emergency room for evaluation of sudden onset dizziness which started about 5 AM on the day of admission. Patient states that she was in her usual state of health until about 5 AM this morning when she woke up to use the bathroom.  She states that she became very dizzy and lightheaded and had to lay back down.  Dizziness is worse with changes in position of her head as well as when she attempts to sit up.  She describes the room as spinning and she also has ringing in both ears.  CT scan of the head without contrast which showed asymmetric low-attenuation in the right basal ganglia may reflect remote lacunar infarct versus dilated perivascular space.   MRI brain: 1.  No evidence of an acute intracranial abnormality. 2. Small T2 hyperintense focus within the right basal ganglia/internal capsule, consistent with a prominent perivascular space. 3. Partially empty sella turcica. This finding can reflect incidental anatomic variation, or alternatively, it can be associated with idiopathic intracranial hypertension (pseudotumor cerebri). 4. Trace fluid within the right mastoid air cells.  Vertigo --Patient presents to the ER for evaluation of dizziness which started suddenly at about 5 AM and is worse with changes in position of her head as  well as when she attempts to sit up. --Dizziness is associated with nausea but no emesis --Concern for possible BPV  --Treat patient with meclizine and pt feels better. Did ambulate today w/o any issues --pt to call and make appt with ENT (info given)   Hypothyroidism Continue Synthroid   Hypertension Hold hydrochlorothiazide for now since patient is normotensive   GAD (generalized anxiety disorder) Continue paroxetine.  Overall improving D/c to home. Pt agreeable         Disposition: Home Diet recommendation:  Discharge Diet Orders (From admission, onward)     Start     Ordered   07/03/22 0000  Diet - low sodium heart healthy        07/03/22 0943           Cardiac diet DISCHARGE MEDICATION: Allergies as of 07/03/2022       Reactions   Latex Rash   Penicillins Rash        Medication List     STOP taking these medications    hydrochlorothiazide 12.5 MG tablet Commonly known as: HYDRODIURIL   hydrochlorothiazide 25 MG tablet Commonly known as: HYDRODIURIL   ondansetron 4 MG disintegrating tablet Commonly known as: Zofran ODT   predniSONE 20 MG tablet Commonly known as: DELTASONE   sertraline 50 MG tablet Commonly known as: ZOLOFT       TAKE these medications    levothyroxine 75 MCG tablet Commonly known as: SYNTHROID Take 75 mcg by mouth daily. What changed: Another medication with the same name was removed. Continue taking this medication, and follow the directions  you see here.   meclizine 25 MG tablet Commonly known as: ANTIVERT Take 1 tablet (25 mg total) by mouth 3 (three) times daily as needed.   PARoxetine 30 MG tablet Commonly known as: PAXIL Take 30 mg by mouth every morning.   pregabalin 75 MG capsule Commonly known as: LYRICA Take 75 mg by mouth 2 (two) times daily.        Follow-up Information     Vaught, Jeannie Fend, MD. Call in 1 week(s).   Specialty: Otolaryngology Why: pt to call and make appt for vertigo f/u  and sinus congestion(chronic) Contact information: [place] [address] [phone]                 Filed Weights   07/02/22 1123  Weight: 89.8 kg     Condition at discharge: fair  The results of significant diagnostics from this hospitalization (including imaging, microbiology, ancillary and laboratory) are listed below for reference.   Imaging Studies: MR BRAIN WO CONTRAST  Result Date: 07/02/2022 CLINICAL DATA:  Provided history: Neuro deficit, acute, stroke suspected. Sudden onset of dizziness at 5 a.m. today. EXAM: MRI HEAD WITHOUT CONTRAST TECHNIQUE: Multiplanar, multiecho pulse sequences of the brain and surrounding structures were obtained without intravenous contrast. COMPARISON:  Head CT 07/02/2022. Report from brain MRI 02/02/2016 (images available, report unavailable). FINDINGS: Brain: No age advanced or lobar predominant parenchymal atrophy. Small T2 hyperintense focus within the right basal ganglia/internal capsule, consistent with a prominent perivascular space (series 10, image 14). Partially empty sella turcica. No cortical encephalomalacia is identified. No significant cerebral white matter disease for age. There is no acute infarct. No evidence of an intracranial mass. No chronic intracranial blood products. No extra-axial fluid collection. No midline shift. Vascular: Maintained flow voids within the proximal large arterial vessels. Skull and upper cervical spine: No focal suspicious marrow lesion. Sinuses/Orbits: No mass or acute finding within the imaged orbits. No significant paranasal sinus disease. Other: Trace fluid within the right mastoid air cells. IMPRESSION: 1.  No evidence of an acute intracranial abnormality. 2. Small T2 hyperintense focus within the right basal ganglia/internal capsule, consistent with a prominent perivascular space. 3. Partially empty sella turcica. This finding can reflect incidental anatomic  variation, or alternatively, it can be associated with idiopathic intracranial hypertension (pseudotumor cerebri). 4. Trace fluid within the right mastoid air cells. Electronically Signed   By: Kellie Simmering D.O.   On: 07/02/2022 17:08   CT Head Wo Contrast  Result Date: 07/02/2022 CLINICAL DATA:  Vertigo. EXAM: CT HEAD WITHOUT CONTRAST TECHNIQUE: Contiguous axial images were obtained from the base of the skull through the vertex without intravenous contrast. RADIATION DOSE REDUCTION: This exam was performed according to the departmental dose-optimization program which includes automated exposure control, adjustment of the mA and/or kV according to patient size and/or use of iterative reconstruction technique. COMPARISON:  Brain MRI 02/02/2016 FINDINGS: Brain: No evidence of acute infarction, hemorrhage, hydrocephalus, extra-axial collection or mass lesion/mass effect. Focal asymmetric low-attenuation in the right basal ganglia notified which may reflect remote lacunar infarct versus dilated perivascular space. Vascular: No hyperdense vessel or unexpected calcification. Skull: Normal. Negative for fracture or focal lesion. Sinuses/Orbits: Paranasal sinuses and mastoid air cells are clear. Other: None. IMPRESSION: 1. No acute intracranial abnormalities. 2. Asymmetric low-attenuation in the right basal ganglia may reflect remote lacunar infarct versus dilated perivascular space. Electronically Signed   By: Kerby Moors M.D.   On: 07/02/2022 12:40    Microbiology: Results for  orders placed or performed during the hospital encounter of 02/02/22  SARS Coronavirus 2 by RT PCR (hospital order, performed in [facility] hospital lab) *cepheid single result test* Anterior Nasal Swab     Status: None   Collection Time: 02/03/22  3:06 AM   Specimen: Anterior Nasal Swab  Result Value Ref Range Status   SARS Coronavirus 2 by RT PCR NEGATIVE NEGATIVE Final    Comment: (NOTE) SARS-CoV-2 target nucleic acids are NOT  DETECTED.  The SARS-CoV-2 RNA is generally detectable in upper and lower respiratory specimens during the acute phase of infection. The lowest concentration of SARS-CoV-2 viral copies this assay can detect is 250 copies / mL. A negative result does not preclude SARS-CoV-2 infection and should not be used as the sole basis for treatment or other patient management decisions.  A negative result may occur with improper specimen collection / handling, submission of specimen other than nasopharyngeal swab, presence of viral mutation(s) within the areas targeted by this assay, and inadequate number of viral copies (<250 copies / mL). A negative result must be combined with clinical observations, patient history, and epidemiological information.  Fact Sheet for Patients:   https://www.[place].info/  Fact Sheet for Healthcare Providers: https://hall.com/  This test is not yet approved or  cleared by the [place] FDA and has been authorized for detection and/or diagnosis of SARS-CoV-2 by FDA under an Emergency Use Authorization (EUA).  This EUA will remain in effect (meaning this test can be used) for the duration of the COVID-19 declaration under Section 564(b)(1) of the Act, 21 U.S.C. section 360bbb-3(b)(1), unless the authorization is terminated or revoked sooner.  Performed at [facility], [address] [place], [place] [address]     Labs: CBC: Recent Labs  Lab 07/02/22 1130 07/03/22 0402  WBC 7.3 7.0  NEUTROABS 5.2  --   HGB 13.9 13.4  HCT 41.5 40.6  MCV 86.3 87.7  PLT 341 [id number]   Basic Metabolic Panel: Recent Labs  Lab 07/02/22 1130 07/03/22 0402  NA 137 141  K 3.2* 4.2  CL 100 105  CO2 28 32  GLUCOSE 122* 113*  BUN 11 9  CREATININE 0.81 0.91  CALCIUM 8.7* 8.7*  MG 2.2  --     Discharge time spent: greater than 30 minutes.  Signed: Fritzi Mandes, MD [facility] 07/03/2022

## 2022-07-03 NOTE — Discharge Instructions (Signed)
Pt to keep f/u appt with [facility] on next tuesday

## 2022-07-03 NOTE — Progress Notes (Signed)
Discharge instructions reviewed with patient. Pt questions answered. She verbalized understanding of instructions. Discharge packet given to patient

## 2023-05-07 ENCOUNTER — Emergency Department: Payer: MEDICAID

## 2023-05-07 ENCOUNTER — Emergency Department
Admission: EM | Admit: 2023-05-07 | Discharge: 2023-05-08 | Disposition: A | Discharge status: Home / Self Care | Payer: MEDICAID | Attending: Student in an Organized Health Care Education/Training Program | Admitting: Student in an Organized Health Care Education/Training Program

## 2023-05-07 DIAGNOSIS — Z79899 Other long term (current) drug therapy: Secondary | ICD-10-CM | POA: Insufficient documentation

## 2023-05-07 DIAGNOSIS — R410 Disorientation, unspecified: Secondary | ICD-10-CM | POA: Insufficient documentation

## 2023-05-07 DIAGNOSIS — R079 Chest pain, unspecified: Secondary | ICD-10-CM | POA: Insufficient documentation

## 2023-05-07 LAB — COMPREHENSIVE METABOLIC PANEL
ALT: 23 U/L (ref 0–44)
AST: 22 U/L (ref 15–41)
Albumin: 3.7 g/dL (ref 3.5–5.0)
Alkaline Phosphatase: 76 U/L (ref 38–126)
Anion gap: 12 (ref 5–15)
BUN: 10 mg/dL (ref 6–20)
CO2: 23 mmol/L (ref 22–32)
Calcium: 8.6 mg/dL — ABNORMAL LOW (ref 8.9–10.3)
Chloride: 102 mmol/L (ref 98–111)
Creatinine, Ser: 0.85 mg/dL (ref 0.44–1.00)
GFR, Estimated: >60 mL/min (ref >60)
Glucose, Bld: 130 mg/dL — ABNORMAL HIGH (ref 70–99)
Potassium: 3.3 mmol/L — ABNORMAL LOW (ref 3.5–5.1)
Sodium: 137 mmol/L (ref 135–145)
Total Bilirubin: 0.7 mg/dL (ref <1.2)
Total Protein: 7.2 g/dL (ref 6.5–8.1)

## 2023-05-07 LAB — CBC WITH DIFFERENTIAL/PLATELET
Abs Immature Granulocytes: 0.06 10*3/uL (ref 0.00–0.07)
Basophils Absolute: 0.1 10*3/uL (ref 0.0–0.1)
Basophils Relative: 1 %
Eosinophils Absolute: 0.3 10*3/uL (ref 0.0–0.5)
Eosinophils Relative: 5 %
HCT: 40.9 % (ref 36.0–46.0)
Hemoglobin: 13.4 g/dL (ref 12.0–15.0)
Immature Granulocytes: 1 %
Lymphocytes Relative: 32 %
Lymphs Abs: 2.4 10*3/uL (ref 0.7–4.0)
MCH: 29.4 pg (ref 26.0–34.0)
MCHC: 32.8 g/dL (ref 30.0–36.0)
MCV: 89.7 fL (ref 80.0–100.0)
Monocytes Absolute: 0.5 10*3/uL (ref 0.1–1.0)
Monocytes Relative: 7 %
Neutro Abs: 4.3 10*3/uL (ref 1.7–7.7)
Neutrophils Relative %: 54 %
Platelets: 314 10*3/uL (ref 150–400)
RBC: 4.56 MIL/uL (ref 3.87–5.11)
RDW: 12.7 % (ref 11.5–15.5)
WBC: 7.6 10*3/uL (ref 4.0–10.5)
nRBC: 0 % (ref 0.0–0.2)

## 2023-05-07 LAB — TROPONIN I (HIGH SENSITIVITY)
Troponin I (High Sensitivity): 4 ng/L (ref <18)
Troponin I (High Sensitivity): 6 ng/L (ref <18)

## 2023-05-07 MED ORDER — IOHEXOL 350 MG/ML SOLN
100.0000 mL | Freq: Once | INTRAVENOUS | Status: AC | PRN
  Administered 2023-05-07: 100 mL via INTRAVENOUS

## 2023-05-07 MED ORDER — DIAZEPAM 5 MG PO TABS
5.0000 mg | ORAL_TABLET | Freq: Once | ORAL | Status: AC
Start: 2023-05-07 — End: 2023-05-07
  Administered 2023-05-07: 5 mg via ORAL
  Filled 2023-05-07 (×2): qty 1

## 2023-05-07 NOTE — ED Triage Notes (Signed)
Pt arrived via EMS complaining of chest pain. Per EMS pt became confused and hypertensive. BP was 180/115, EMS gave 1 of  and BP decreased to 150 systolic.

## 2023-05-07 NOTE — ED Provider Notes (Signed)
[facility] Provider Note    Event Date/Time   First MD Initiated Contact with Patient 05/07/23 1947     (approximate)   History   No chief complaint on file.   HPI  Linda Parsons is a 57 y.o. female who presents to the ER for evaluation of chest pain as well as confusion.  States that the symptoms started with episode of confusion worse she states that she was having very strange thoughts and racing thoughts while she was sitting at home.  Told her son that she was having the symptoms and then shortly thereafter started having some chest pain radiating through to her back.  Denies any fevers or chills.  No pleuritic chest pain.  Does have a history of anxiety.     Physical Exam   Triage Vital Signs: ED Triage Vitals  Encounter Vitals Group     BP      Systolic BP Percentile      Diastolic BP Percentile      Pulse      Resp      Temp      Temp src      SpO2      Weight      Height      Head Circumference      Peak Flow      Pain Score      Pain Loc      Pain Education      Exclude from Growth Chart     Most recent vital signs: Vitals:   05/07/23 1952  BP: (!) 137/92  Pulse: 92  Resp: 12  Temp: 98.7 F (37.1 C)  SpO2: 94%     Constitutional: Alert, tearful, anxious appearing Eyes: Conjunctivae are normal.  Head: Atraumatic. Nose: No congestion/rhinnorhea. Mouth/Throat: Mucous membranes are moist.   Neck: Painless ROM.  Cardiovascular:   Good peripheral circulation. Respiratory: Normal respiratory effort.  No retractions.  Gastrointestinal: Soft and nontender.  Musculoskeletal:  no deformity Neurologic:  MAE spontaneously. No gross focal neurologic deficits are appreciated.  Skin:  Skin is warm, dry and intact. No rash noted. Psychiatric: Mood and affect are normal. Speech and behavior are normal.    ED Results / Procedures / Treatments   Labs (all labs ordered are listed, but only abnormal results are  displayed) Labs Reviewed  COMPREHENSIVE METABOLIC PANEL - Abnormal; Notable for the following components:      Result Value   Potassium 3.3 (*)    Glucose, Bld 130 (*)    Calcium 8.6 (*)    All other components within normal limits  CBC WITH DIFFERENTIAL/PLATELET  TROPONIN I (HIGH SENSITIVITY)  TROPONIN I (HIGH SENSITIVITY)     EKG  ED ECG REPORT I, Willy Eddy, the attending physician, personally viewed and interpreted this ECG.   Date: 05/07/2023  EKG Time: 20:07  Rate: 85  Rhythm: sinus  Axis: normal  Intervals: normal  ST&T Change: no stemi, no depressions    RADIOLOGY Please see ED Course for my review and interpretation.  I personally reviewed all radiographic images ordered to evaluate for the above acute complaints and reviewed radiology reports and findings.  These findings were personally discussed with the patient.  Please see medical record for radiology report.    PROCEDURES:  Critical Care performed:   Procedures   MEDICATIONS ORDERED IN ED: Medications  diazepam (VALIUM) tablet 5 mg (5 mg Oral Given 05/07/23 2030)  iohexol (OMNIPAQUE) 350 MG/ML injection 100 mL (  100 mLs Intravenous Contrast Given 05/07/23 2054)     IMPRESSION / MDM / ASSESSMENT AND PLAN / ED COURSE  I reviewed the triage vital signs and the nursing notes.                              Differential diagnosis includes, but is not limited to, ACS, pericarditis, esophagitis, boerhaaves, pe, dissection, pna, bronchitis, costochondritis  Patient presenting to the ER for evaluation of symptoms as described above.  Based on symptoms, risk factors and considered above differential, this presenting complaint could reflect a potentially life-threatening illness therefore the patient will be placed on continuous pulse oximetry and telemetry for monitoring.  Laboratory evaluation will be sent to evaluate for the above complaints.  Patient anxious appearing will give anxiolysis.  She is  complaining of chest pain as well as some confusion will order CT head but she is not demonstrating any evidence of acute CVA.  Will order CTA given reported pain ripping tearing through to her back.  Her abdominal exam is soft and benign.  EKG is nonischemic will observe for serial enzymes.    Clinical Course as of 05/07/23 2256  Wynelle Link May 07, 2023  2106 CTA my review and interpretation without evidence of dissection.  Troponin negative. [PR]  2253 Patient reassessed.  Remains well-appearing.  Workup is reassuring.  Not consistent with ACS not consistent with dissection low suspicion for PE.  No sign of CVA no symptoms to suggest CVA.  Does admit stress and anxiety symptoms did improve after Valium.  Does appear stable and appropriate for outpatient follow-up. [PR]    Clinical Course User Index [PR] Willy Eddy, MD     FINAL CLINICAL IMPRESSION(S) / ED DIAGNOSES   Final diagnoses:  Chest pain, unspecified type     Rx / DC Orders   ED Discharge Orders     None        Note:  This document was prepared using Dragon voice recognition software and may include unintentional dictation errors.    Willy Eddy, MD 05/07/23 2256

## 2023-05-08 LAB — URINALYSIS, ROUTINE W REFLEX MICROSCOPIC
Bilirubin Urine: NEGATIVE
Glucose, UA: NEGATIVE mg/dL
Hgb urine dipstick: NEGATIVE
Ketones, ur: NEGATIVE mg/dL
Leukocytes,Ua: NEGATIVE
Nitrite: NEGATIVE
Protein, ur: NEGATIVE mg/dL
Specific Gravity, Urine: 1.013 (ref 1.005–1.030)
pH: 6 (ref 5.0–8.0)

## 2023-05-08 LAB — URINE DRUG SCREEN, QUALITATIVE (ARMC ONLY)
Amphetamines, Ur Screen: NOT DETECTED
Barbiturates, Ur Screen: NOT DETECTED
Benzodiazepine, Ur Scrn: NOT DETECTED
Cannabinoid 50 Ng, Ur ~~LOC~~: NOT DETECTED
Cocaine Metabolite,Ur ~~LOC~~: NOT DETECTED
MDMA (Ecstasy)Ur Screen: NOT DETECTED
Methadone Scn, Ur: NOT DETECTED
Opiate, Ur Screen: NOT DETECTED
Phencyclidine (PCP) Ur S: NOT DETECTED
Tricyclic, Ur Screen: NOT DETECTED
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
## 2023-08-04 ENCOUNTER — Other Ambulatory Visit: Payer: Self-pay

## 2023-08-04 ENCOUNTER — Emergency Department
Admission: EM | Admit: 2023-08-04 | Discharge: 2023-08-05 | Disposition: A | Discharge status: Home / Self Care | Payer: MEDICAID | Attending: Emergency Medicine | Admitting: Emergency Medicine

## 2023-08-04 ENCOUNTER — Emergency Department: Payer: MEDICAID

## 2023-08-04 DIAGNOSIS — T3995XA Adverse effect of unspecified nonopioid analgesic, antipyretic and antirheumatic, initial encounter: Secondary | ICD-10-CM | POA: Diagnosis not present

## 2023-08-04 DIAGNOSIS — R202 Paresthesia of skin: Secondary | ICD-10-CM | POA: Insufficient documentation

## 2023-08-04 DIAGNOSIS — T887XXA Unspecified adverse effect of drug or medicament, initial encounter: Secondary | ICD-10-CM | POA: Diagnosis not present

## 2023-08-04 LAB — RESP PANEL BY RT-PCR (RSV, FLU A&B, COVID)  RVPGX2
Influenza A by PCR: NEGATIVE
Influenza B by PCR: NEGATIVE
Resp Syncytial Virus by PCR: NEGATIVE
SARS Coronavirus 2 by RT PCR: NEGATIVE

## 2023-08-04 LAB — CBC
HCT: 44.2 % (ref 36.0–46.0)
Hemoglobin: 14.7 g/dL (ref 12.0–15.0)
MCH: 29.2 pg (ref 26.0–34.0)
MCHC: 33.3 g/dL (ref 30.0–36.0)
MCV: 87.9 fL (ref 80.0–100.0)
Platelets: 346 10*3/uL (ref 150–400)
RBC: 5.03 MIL/uL (ref 3.87–5.11)
RDW: 13 % (ref 11.5–15.5)
WBC: 9.2 10*3/uL (ref 4.0–10.5)
nRBC: 0 % (ref 0.0–0.2)

## 2023-08-04 LAB — BASIC METABOLIC PANEL
Anion gap: 8 (ref 5–15)
BUN: 13 mg/dL (ref 6–20)
CO2: 29 mmol/L (ref 22–32)
Calcium: 9.4 mg/dL (ref 8.9–10.3)
Chloride: 99 mmol/L (ref 98–111)
Creatinine, Ser: 0.7 mg/dL (ref 0.44–1.00)
GFR, Estimated: >60 mL/min (ref >60)
Glucose, Bld: 123 mg/dL — ABNORMAL HIGH (ref 70–99)
Potassium: 3.7 mmol/L (ref 3.5–5.1)
Sodium: 136 mmol/L (ref 135–145)

## 2023-08-04 LAB — URINALYSIS, ROUTINE W REFLEX MICROSCOPIC
Bilirubin Urine: NEGATIVE
Glucose, UA: NEGATIVE mg/dL
Hgb urine dipstick: NEGATIVE
Ketones, ur: NEGATIVE mg/dL
Leukocytes,Ua: NEGATIVE
Nitrite: NEGATIVE
Protein, ur: NEGATIVE mg/dL
Specific Gravity, Urine: 1.013 (ref 1.005–1.030)
pH: 7 (ref 5.0–8.0)

## 2023-08-04 LAB — TROPONIN I (HIGH SENSITIVITY): Troponin I (High Sensitivity): 5 ng/L (ref <18)

## 2023-08-04 NOTE — ED Provider Notes (Signed)
 [facility] Provider Note    Event Date/Time   First MD Initiated Contact with Patient 08/04/23 2259     (approximate)   History   Dizziness and Chest Pain  The patient and/or family speak(s) Spanish natively but also speaks [place].  They understand they have the right to the use of a hospital interpreter, however at this time they prefer to speak directly with me in [place].  They know that they can ask for an interpreter at any time.   HPI Linda Parsons is a 58 y.o. female who presents for a variety of symptoms that have been going on for about 3 weeks.  She states that she takes Paxil already and then her psychiatrist also prescribed Prozac on top of the Paxil.  Soon after starting the Prozac, she developed of a variety of symptoms that include tingling and numbness all throughout various parts of her body including her face and extremities although the location and the severity of the numbness and tingling vary from time to time.  Sometimes she feels unsteady on her feet.  She has been anxious and worried more than usual and has been having bad nightmares.  She has at time had some chest pressure and difficulty breathing although not as much recently.  She had a follow-up appointment with family medicine at [facility] about 8 days ago and was told everything looked good.  When her symptoms continued, and after she did some research on her own about SSRIs, she stopped taking the Prozac about 5 days ago.  She called her psychiatrist today who told her she should go to the emergency department to make sure she does not have an infection or other emergent issue.     Physical Exam   Triage Vital Signs: ED Triage Vitals [08/04/23 2045]  Encounter Vitals Group     BP 123/68     Systolic BP Percentile      Diastolic BP Percentile      Pulse Rate 83     Resp 18     Temp 98.2 F (36.8 C)     Temp Source Oral     SpO2 95 %     Weight      Height      Head  Circumference      Peak Flow      Pain Score 7     Pain Loc      Pain Education      Exclude from Growth Chart     Most recent vital signs: Vitals:   08/04/23 2045  BP: 123/68  Pulse: 83  Resp: 18  Temp: 98.2 F (36.8 C)  SpO2: 95%    General: Awake, no distress.  appears generally healthy and well-appearing. Eyes:  Pupils are equal and reactive, extraocular movement is intact, no nystagmus. CV:  Good peripheral perfusion.  Regular rate and rhythm. Resp:  Normal effort. Speaking easily and comfortably, no accessory muscle usage nor intercostal retractions.   Abd:  No distention.  Other:  No focal neurological deficits.  NIH stroke scale of 1 for some subjective numbness that has been present for about 3 weeks.  Patient is ambulatory with a steady gait.  Negative Romberg, no pronator drift.  Normal grip strength bilaterally, equal and normal upper and lower extremity strength.  Patellar reflexes are 2+ bilaterally.   ED Results / Procedures / Treatments   Labs (all labs ordered are listed, but only abnormal results are displayed) Labs Reviewed  BASIC METABOLIC PANEL - Abnormal; Notable for the following components:      Result Value   Glucose, Bld 123 (*)    All other components within normal limits  URINALYSIS, ROUTINE W REFLEX MICROSCOPIC - Abnormal; Notable for the following components:   Color, Urine YELLOW (*)    APPearance CLOUDY (*)    All other components within normal limits  RESP PANEL BY RT-PCR (RSV, FLU A&B, COVID)  RVPGX2  CBC  TROPONIN I (HIGH SENSITIVITY)     EKG  ED ECG REPORT I, Loleta Rose, the attending physician, personally viewed and interpreted this ECG.  Date: 08/04/2023 EKG Time: 20: 48 Rate: 90 Rhythm: normal sinus rhythm QRS Axis: normal Intervals: normal ST/T Wave abnormalities: normal Narrative Interpretation: no evidence of acute ischemia    RADIOLOGY I viewed and interpreted the patient's two-view chest x-ray and I see no  evidence of pneumonia nor other acute abnormality.  I also read the radiologist's report, which confirmed no acute findings.   PROCEDURES:  Critical Care performed: No  Procedures    IMPRESSION / MDM / ASSESSMENT AND PLAN / ED COURSE  I reviewed the triage vital signs and the nursing notes.                              Differential diagnosis includes, but is not limited to, medication side effects, serotonin syndrome, normal active malignant syndrome, CVA, neoplasm, vitamin deficiency, electrolyte abnormality, viral illness or bacterial infection.  Patient's presentation is most consistent with acute presentation with potential threat to life or bodily function.  Labs/studies ordered: 2 view chest x-ray, EKG, urinalysis, respiratory viral panel, high-sensitivity troponin, basic metabolic panel, CBC  Interventions/Medications given:  Medications - No data to display  (Note:  hospital course my include additional interventions and/or labs/studies not listed above.)   Physical exam including neuroexam very reassuring.  No indication of CVA or acute neurological deficit.  Symptoms are very consistent with side effects associated with SSRI usage, particularly in the setting of starting a second agent on top of already taking Paxil.  Labs are all within normal limits, no indication to repeat high-sensitivity troponin.  No indication for advanced imaging such as MRI or head CT.  I provided reassurance that there is no evidence of an emergent medical condition and I agreed with her that she should continue to not take any more of the Prozac and to follow-up with her psychiatrist.  She will also follow-up with family medicine discuss vitamin supplementation such as B12 but I do not think that that is likely to be the acute cause of the 3 weeks of symptoms.  She seems reassured by the discussion and will follow-up with her outpatient providers.  I gave my usual and customary return  precautions.     FINAL CLINICAL IMPRESSION(S) / ED DIAGNOSES   Final diagnoses:  Paresthesias  Medication side effect     Rx / DC Orders   ED Discharge Orders     None        Note:  This document was prepared using Dragon voice recognition software and may include unintentional dictation errors.   Loleta Rose, MD 08/05/23 Marlyne Beards

## 2023-08-04 NOTE — ED Triage Notes (Signed)
 Pt c/o generalized weakness, chills, generalized tingling, chest pressure, and difficulty breathing, headache. Pt is AOX4, NAD noted. Pt states s/s started after taking Prozac 3 weeks ago- reports she stopped taking med 5 days ago.

## 2023-08-04 NOTE — ED Notes (Signed)
 Patient to waiting room via wheelchair by EMS from home.  Per EMS patient with complaint of generalized weakness and tingling in arms and leg for approx 3 weeks.  States started new medication.  BP - 157/95 P - 80 98% room air EKG - sinus rhythm

## 2023-08-05 NOTE — Discharge Instructions (Signed)
 As we discussed, your evaluation was reassuring, and it does not appear that you have an emergent issue such as an infection.  Your symptoms are most likely the result of a side effect of taking the Prozac on top of the Paxil you were already taking.  Please do not take any more Prozac and follow-up with your psychiatrist at the next fillable opportunity.  You can also follow-up with your primary care doctor to discuss additional outpatient testing such as vitamin levels to see if there are additional supplements she may be able to take.    Return to the emergency department if you develop new or worsening symptoms that concern you.

## 2023-08-08 ENCOUNTER — Emergency Department: Payer: MEDICAID

## 2023-08-08 ENCOUNTER — Other Ambulatory Visit: Payer: Self-pay

## 2023-08-08 ENCOUNTER — Telehealth: Payer: MEDICAID | Admitting: Physician Assistant

## 2023-08-08 ENCOUNTER — Emergency Department
Admission: EM | Admit: 2023-08-08 | Discharge: 2023-08-09 | Disposition: A | Discharge status: Home / Self Care | Payer: MEDICAID | Attending: Emergency Medicine | Admitting: Emergency Medicine

## 2023-08-08 DIAGNOSIS — R42 Dizziness and giddiness: Secondary | ICD-10-CM

## 2023-08-08 DIAGNOSIS — I10 Essential (primary) hypertension: Secondary | ICD-10-CM | POA: Insufficient documentation

## 2023-08-08 DIAGNOSIS — E876 Hypokalemia: Secondary | ICD-10-CM | POA: Diagnosis not present

## 2023-08-08 DIAGNOSIS — E039 Hypothyroidism, unspecified: Secondary | ICD-10-CM | POA: Diagnosis not present

## 2023-08-08 LAB — TROPONIN I (HIGH SENSITIVITY): Troponin I (High Sensitivity): 5 ng/L (ref <18)

## 2023-08-08 LAB — COMPREHENSIVE METABOLIC PANEL
ALT: 25 U/L (ref 0–44)
AST: 10 U/L — ABNORMAL LOW (ref 15–41)
Albumin: 3.7 g/dL (ref 3.5–5.0)
Alkaline Phosphatase: 65 U/L (ref 38–126)
Anion gap: 6 (ref 5–15)
BUN: 14 mg/dL (ref 6–20)
CO2: 27 mmol/L (ref 22–32)
Calcium: 8.8 mg/dL — ABNORMAL LOW (ref 8.9–10.3)
Chloride: 103 mmol/L (ref 98–111)
Creatinine, Ser: 0.77 mg/dL (ref 0.44–1.00)
GFR, Estimated: >60 mL/min (ref >60)
Glucose, Bld: 133 mg/dL — ABNORMAL HIGH (ref 70–99)
Potassium: 2.9 mmol/L — ABNORMAL LOW (ref 3.5–5.1)
Sodium: 136 mmol/L (ref 135–145)
Total Bilirubin: 0.7 mg/dL (ref 0.0–1.2)
Total Protein: 7.2 g/dL (ref 6.5–8.1)

## 2023-08-08 LAB — CBC
HCT: 42.5 % (ref 36.0–46.0)
Hemoglobin: 14.6 g/dL (ref 12.0–15.0)
MCH: 29.5 pg (ref 26.0–34.0)
MCHC: 34.4 g/dL (ref 30.0–36.0)
MCV: 85.9 fL (ref 80.0–100.0)
Platelets: 337 10*3/uL (ref 150–400)
RBC: 4.95 MIL/uL (ref 3.87–5.11)
RDW: 13.2 % (ref 11.5–15.5)
WBC: 9.5 10*3/uL (ref 4.0–10.5)
nRBC: 0 % (ref 0.0–0.2)

## 2023-08-08 LAB — MAGNESIUM: Magnesium: 2 mg/dL (ref 1.7–2.4)

## 2023-08-08 MED ORDER — SODIUM CHLORIDE 0.9 % IV BOLUS
1000.0000 mL | Freq: Once | INTRAVENOUS | Status: AC
  Administered 2023-08-08: 1000 mL via INTRAVENOUS

## 2023-08-08 MED ORDER — PROCHLORPERAZINE EDISYLATE 10 MG/2ML IJ SOLN
5.0000 mg | Freq: Once | INTRAMUSCULAR | Status: AC
  Administered 2023-08-09: 5 mg via INTRAVENOUS
  Filled 2023-08-08: qty 2

## 2023-08-08 MED ORDER — POTASSIUM CHLORIDE CRYS ER 20 MEQ PO TBCR
40.0000 meq | EXTENDED_RELEASE_TABLET | Freq: Once | ORAL | Status: AC
  Administered 2023-08-08: 40 meq via ORAL
  Filled 2023-08-08: qty 2

## 2023-08-08 MED ORDER — IOHEXOL 350 MG/ML SOLN
75.0000 mL | Freq: Once | INTRAVENOUS | Status: AC | PRN
  Administered 2023-08-08: 75 mL via INTRAVENOUS

## 2023-08-08 MED ORDER — KETOROLAC TROMETHAMINE 15 MG/ML IJ SOLN
15.0000 mg | Freq: Once | INTRAMUSCULAR | Status: AC
  Administered 2023-08-09: 15 mg via INTRAVENOUS
  Filled 2023-08-08: qty 1

## 2023-08-08 MED ORDER — MECLIZINE HCL 25 MG PO TABS
25.0000 mg | ORAL_TABLET | Freq: Once | ORAL | Status: AC
  Administered 2023-08-08: 25 mg via ORAL
  Filled 2023-08-08: qty 1

## 2023-08-08 NOTE — Patient Instructions (Signed)
  Linda Parsons, thank you for joining Linda Loveless, PA-C for today's virtual visit.  While this provider is not your primary care provider (PCP), if your PCP is located in our provider database this encounter information will be shared with them immediately following your visit.   A [facility] MyChart account gives you access to today's visit and all your visits, tests, and labs performed at [facility] " click here if you don't have a [facility] MyChart account or go to [email or web address]  Consent: (Patient) Linda Parsons provided verbal consent for this virtual visit at the beginning of the encounter.  Current Medications:  Current Outpatient Medications:    levothyroxine (SYNTHROID) 75 MCG tablet, Take 75 mcg by mouth daily., Disp: , Rfl:    meclizine (ANTIVERT) 25 MG tablet, Take 1 tablet (25 mg total) by mouth 3 (three) times daily as needed., Disp: 20 tablet, Rfl: 0   PARoxetine (PAXIL) 30 MG tablet, Take 30 mg by mouth every morning., Disp: , Rfl:    pregabalin (LYRICA) 75 MG capsule, Take 75 mg by mouth 2 (two) times daily., Disp: , Rfl:    Medications ordered in this encounter:  No orders of the defined types were placed in this encounter.    *If you need refills on other medications prior to your next appointment, please contact your pharmacy*  Follow-Up: Call back or seek an in-person evaluation if the symptoms worsen or if the condition fails to improve as anticipated.  [facility] [phone]  Other Instructions  Recommend to seek in person evaluation at closest Emergency Department   If you have been instructed to have an in-person evaluation today at a local Urgent Care facility, please use the link below. It will take you to a list of all of our available [facility], including address, phone number and hours of operation. Please do not delay care.  [facility]  If you or a family member do  not have a primary care provider, use the link below to schedule a visit and establish care. When you choose a [facility] primary care physician or advanced practice provider, you gain a long-term partner in health. Find a Primary Care Provider  Learn more about [facility]'s in-office and virtual care options: [facility] - Get Care Now

## 2023-08-08 NOTE — ED Triage Notes (Signed)
 Pt arrives via EMS from home with c/o nausea and dizziness. Hx of vertigo and HTN. A&Ox4.

## 2023-08-08 NOTE — Progress Notes (Signed)
 Virtual Visit Consent   Linda Parsons, you are scheduled for a virtual visit with a [facility] provider today. Just as with appointments in the office, your consent must be obtained to participate. Your consent will be active for this visit and any virtual visit you may have with one of our providers in the next 365 days. If you have a MyChart account, a copy of this consent can be sent to you electronically.  As this is a virtual visit, video technology does not allow for your provider to perform a traditional examination. This may limit your provider's ability to fully assess your condition. If your provider identifies any concerns that need to be evaluated in person or the need to arrange testing (such as labs, EKG, etc.), we will make arrangements to do so. Although advances in technology are sophisticated, we cannot ensure that it will always work on either your end or our end. If the connection with a video visit is poor, the visit may have to be switched to a telephone visit. With either a video or telephone visit, we are not always able to ensure that we have a secure connection.  By engaging in this virtual visit, you consent to the provision of healthcare and authorize for your insurance to be billed (if applicable) for the services provided during this visit. Depending on your insurance coverage, you may receive a charge related to this service.  I need to obtain your verbal consent now. Are you willing to proceed with your visit today? Linda Parsons has provided verbal consent on 08/08/2023 for a virtual visit (video or telephone). [name], PA-C  Date: 08/08/2023 7:43 PM   Virtual Visit via Video Note   IMargaretann Loveless, connected with  Linda Parsons  ([id number], [birth year]) on 08/08/23 at  7:30 PM EDT by a video-enabled telemedicine application and verified that I am speaking with the correct person using two identifiers.  Location: Patient: Virtual Visit  Location Patient: Home Provider: Virtual Visit Location Provider: Home Office   I discussed the limitations of evaluation and management by telemedicine and the availability of in person appointments. The patient expressed understanding and agreed to proceed.    History of Present Illness: Linda Parsons is a 58 y.o. who identifies as a female who was assigned female at birth, and is being seen today for vertigo and pain on right side of face and ear.  HPI: Dizziness This is a new problem. The current episode started in the past 7 days (Saturday, 08/05/23; Yesterday was worst day, still has today, but not as bad as yesterday). The problem occurs constantly. The problem has been unchanged. Associated symptoms include fatigue, headaches (pain over right temporal area and in front of right ear), nausea, neck pain (mildly stiff from vertigo), swollen glands (reports tenderness to touch and mild swelling over preauricular area on the right side) and vertigo. Pertinent negatives include no arthralgias, chills, congestion, coughing, fever, sore throat, visual change or weakness. Associated symptoms comments: Right ear pain. Nothing aggravates the symptoms. She has tried rest (meclizine and doxycycline from PCP without relief) for the symptoms. The treatment provided no relief.   Was seen in ER on 08/04/23 for similar symptoms not including the Vertigo and had been thought to be from taking Paxil and Prozac together for about 3 weeks. She has since stopped Prozac and is only taking half of the Paxil but symptoms persist and have worsened. Still reports some numbness in her body all over  and pain with the left eye.   Problems:  Patient Active Problem List   Diagnosis Date Noted   Vertigo 07/02/2022   Hypertension 07/02/2022   Hypothyroidism 07/02/2022   MDD (major depressive disorder), recurrent episode, moderate (HCC) 02/03/2022   GAD (generalized anxiety disorder) 02/03/2022    Allergies:  Allergies   Allergen Reactions   Latex Rash   Penicillins Rash   Medications:  Current Outpatient Medications:    levothyroxine (SYNTHROID) 75 MCG tablet, Take 75 mcg by mouth daily., Disp: , Rfl:    meclizine (ANTIVERT) 25 MG tablet, Take 1 tablet (25 mg total) by mouth 3 (three) times daily as needed., Disp: 20 tablet, Rfl: 0   PARoxetine (PAXIL) 30 MG tablet, Take 30 mg by mouth every morning., Disp: , Rfl:    pregabalin (LYRICA) 75 MG capsule, Take 75 mg by mouth 2 (two) times daily., Disp: , Rfl:   Observations/Objective: Patient is well-developed, well-nourished in no acute distress.  Resting comfortably at home.  Head is normocephalic, atraumatic.  No labored breathing.  Speech is clear and coherent with logical content.  Patient is alert and oriented at baseline.    Assessment and Plan: 1. Vertigo (Primary)  - Patient now with vertigo with ear pain and headache, along with continued numbness sensations - Currently taking Meclizine and Doxycycline without relief - Advised to seek in person evaluation at closest ER (due to time of day local in person UC facilities are closed)for vertigo symptoms as this is new and not responding to conservative treatment measures - DDx: AOM, ETD, Vertigo, trigeminal neuralgia; considered but lower differential include vestibular neuroma, brain mass/lesion, MS  Recommend to seek in person evaluation at closest Emergency Department  Follow Up Instructions: I discussed the assessment and treatment plan with the patient. The patient was provided an opportunity to ask questions and all were answered. The patient agreed with the plan and demonstrated an understanding of the instructions.  A copy of instructions were sent to the patient via MyChart unless otherwise noted below.    The patient was advised to call back or seek an in-person evaluation if the symptoms worsen or if the condition fails to improve as anticipated.    [name], PA-C

## 2023-08-08 NOTE — ED Notes (Signed)
 This RN introduced self to pt. Call light in reach, bed wheels locked, side rail raised, pt updated on plan of care. Rounding completed. Fall precautions in place.

## 2023-08-08 NOTE — ED Provider Notes (Signed)
 [facility] Provider Note    Event Date/Time   First MD Initiated Contact with Patient 08/08/23 2011     (approximate)   History   Nausea and Dizziness  Offered formal translator multiple times however patient states that she would like to to speak in [place]. HPI  Linda Parsons is a 58 y.o. female past history significant for anxiety, hypertension, hypothyroidism, who presents to the emergency department with dizziness.  Patient was recently evaluated with similar symptoms.  Felt to be peripheral vertigo symptoms and discharged home with meclizine.  Patient states that she just picked up the meclizine today.  Worsening symptoms today where she felt very dizzy and having pain to the right side of her ear.  Felt off balance.  States that she feels better at rest.  Does have a history of vertigo in the past.  Believes that the symptoms have worsened since she was started on Prozac and Paxil.     Physical Exam   Triage Vital Signs: ED Triage Vitals  Encounter Vitals Group     BP      Systolic BP Percentile      Diastolic BP Percentile      Pulse      Resp      Temp      Temp src      SpO2      Weight      Height      Head Circumference      Peak Flow      Pain Score      Pain Loc      Pain Education      Exclude from Growth Chart     Most recent vital signs: There were no vitals filed for this visit.  Physical Exam Constitutional:      Appearance: She is well-developed.  HENT:     Head: Atraumatic.     Right Ear: Tympanic membrane normal.     Left Ear: Tympanic membrane normal.     Ears:     Comments: No tenderness or fullness of the mastoids.  No temporal tenderness. Eyes:     Conjunctiva/sclera: Conjunctivae normal.     Comments: No nystagmus.  Extraocular movements intact.  Cardiovascular:     Rate and Rhythm: Regular rhythm. Tachycardia present.  Pulmonary:     Effort: No respiratory distress.  Abdominal:     General: There  is no distension.  Musculoskeletal:        General: Normal range of motion.     Cervical back: Normal range of motion.  Skin:    General: Skin is warm.  Neurological:     Mental Status: She is alert. Mental status is at baseline.     GCS: GCS eye subscore is 4. GCS verbal subscore is 5. GCS motor subscore is 6.     Cranial Nerves: Cranial nerves 2-12 are intact.     Sensory: Sensation is intact.     Motor: Motor function is intact.    IMPRESSION / MDM / ASSESSMENT AND PLAN / ED COURSE  I reviewed the triage vital signs and the nursing notes.  Differential Neng gnosis including peripheral vertigo, central vertigo, medication side effect, electrolyte abnormality, dehydration  EKG  I, Corena Herter, the attending physician, personally viewed and interpreted this ECG.   Rate: Normal  Rhythm: Normal sinus  Axis: Normal  Intervals: Normal  ST&T Change: None  No tachycardic or bradycardic dysrhythmias while on cardiac telemetry.  RADIOLOGY I independently reviewed imaging, my interpretation of imaging: ***  LABS (all labs ordered are listed, but only abnormal results are displayed) Labs interpreted as -    Labs Reviewed - No data to display   MDM       PROCEDURES:  Critical Care performed: No  Procedures  Patient's presentation is most consistent with {EM COPA:27473}   MEDICATIONS ORDERED IN ED: Medications - No data to display  FINAL CLINICAL IMPRESSION(S) / ED DIAGNOSES   Final diagnoses:  None     Rx / DC Orders   ED Discharge Orders     None        Note:  This document was prepared using Dragon voice recognition software and may include unintentional dictation errors.

## 2023-08-09 MED ORDER — POTASSIUM CHLORIDE CRYS ER 20 MEQ PO TBCR: 20.0000 meq | EXTENDED_RELEASE_TABLET | Freq: Every day | ORAL | 0 refills | Status: AC

## 2023-08-09 NOTE — Discharge Instructions (Signed)
 You were seen in the emergency department following a dizziness episode.  You had a CT scan of your head and neck.  You had an MRI that did not show any new findings.  You have a prescription for meclizine at home, take as needed for dizziness.  You are given information of how to perform Epley maneuvers which can help with vertigo.  Follow-up with the ear nose and throat specialist.  Call your primary care doctor tomorrow.  Follow-up with your psychiatrist.  Return to the emergency department for any return or worsening symptoms.  Your potassium was mildly low when checked today.  Make sure to follow up with a primary doctor to follow up your labs.  Make sure to eat food high in potassium and magnesium - examples - potatoes, spinach, bananas, beans, avocadoes, oranges, nuts.

## 2023-08-09 NOTE — ED Provider Notes (Signed)
-----------------------------------------   1:01 AM on 08/09/2023 -----------------------------------------   MRI Brain: 1. No evidence of acute intracranial abnormality.  2. Partially empty sella, which is often a normal anatomic variant  but can be associated with idiopathic intracranial hypertension.   Updated patient on negative MRI.  Overall feeling significantly better.  Will discharge home with follow-up with ENT.  Strict return precautions given.  Patient verbalizes understanding and agrees with plan of care.   Irean Hong, MD 08/09/23 [phone]
